# Patient Record
Sex: Female | Born: 1965 | Race: White | Hispanic: No | Marital: Married | Smoking: Never smoker
Health system: Southern US, Community
[De-identification: ages and names within clinical notes are randomized; demographics above are authoritative.]

## PROBLEM LIST (undated history)

## (undated) DIAGNOSIS — N879 Dysplasia of cervix uteri, unspecified: Secondary | ICD-10-CM

## (undated) DIAGNOSIS — C801 Malignant (primary) neoplasm, unspecified: Secondary | ICD-10-CM

## (undated) HISTORY — PX: BREAST BIOPSY: SHX20

## (undated) HISTORY — PX: CHOLECYSTECTOMY: SHX55

## (undated) HISTORY — DX: Malignant (primary) neoplasm, unspecified: C80.1

## (undated) HISTORY — PX: TONSILLECTOMY: SUR1361

## (undated) HISTORY — PX: OTHER SURGICAL HISTORY: SHX169

## (undated) HISTORY — PX: GYNECOLOGIC CRYOSURGERY: SHX857

## (undated) HISTORY — DX: Dysplasia of cervix uteri, unspecified: N87.9

---

## 2001-10-12 ENCOUNTER — Emergency Department (HOSPITAL_COMMUNITY): Admission: EM | Admit: 2001-10-12 | Discharge: 2001-10-12 | Payer: Self-pay | Admitting: *Deleted

## 2006-06-20 ENCOUNTER — Other Ambulatory Visit: Admission: RE | Admit: 2006-06-20 | Discharge: 2006-06-20 | Payer: Self-pay | Admitting: Obstetrics and Gynecology

## 2006-07-04 ENCOUNTER — Ambulatory Visit: Payer: Self-pay | Admitting: Internal Medicine

## 2006-07-22 ENCOUNTER — Ambulatory Visit: Payer: Self-pay | Admitting: Internal Medicine

## 2006-08-01 ENCOUNTER — Ambulatory Visit: Payer: Self-pay | Admitting: Unknown Physician Specialty

## 2006-08-16 ENCOUNTER — Ambulatory Visit: Payer: Self-pay | Admitting: Unknown Physician Specialty

## 2006-08-20 ENCOUNTER — Ambulatory Visit: Payer: Self-pay | Admitting: Unknown Physician Specialty

## 2006-10-23 ENCOUNTER — Ambulatory Visit: Payer: Self-pay | Admitting: Unknown Physician Specialty

## 2007-01-16 ENCOUNTER — Ambulatory Visit: Payer: Self-pay | Admitting: Surgery

## 2007-01-21 ENCOUNTER — Ambulatory Visit: Payer: Self-pay | Admitting: Surgery

## 2007-07-17 ENCOUNTER — Other Ambulatory Visit: Admission: RE | Admit: 2007-07-17 | Discharge: 2007-07-17 | Payer: Self-pay | Admitting: Obstetrics and Gynecology

## 2007-08-28 ENCOUNTER — Ambulatory Visit: Payer: Self-pay | Admitting: Internal Medicine

## 2007-09-09 ENCOUNTER — Ambulatory Visit: Payer: Self-pay | Admitting: Internal Medicine

## 2008-03-10 ENCOUNTER — Ambulatory Visit: Payer: Self-pay | Admitting: Internal Medicine

## 2008-03-22 ENCOUNTER — Ambulatory Visit: Payer: Self-pay | Admitting: Internal Medicine

## 2008-08-03 ENCOUNTER — Other Ambulatory Visit: Admission: RE | Admit: 2008-08-03 | Discharge: 2008-08-03 | Payer: Self-pay | Admitting: Obstetrics and Gynecology

## 2008-09-01 ENCOUNTER — Ambulatory Visit: Payer: Self-pay | Admitting: Internal Medicine

## 2008-09-15 ENCOUNTER — Ambulatory Visit: Payer: Self-pay | Admitting: Internal Medicine

## 2008-12-10 DIAGNOSIS — C801 Malignant (primary) neoplasm, unspecified: Secondary | ICD-10-CM

## 2008-12-10 HISTORY — DX: Malignant (primary) neoplasm, unspecified: C80.1

## 2009-08-11 ENCOUNTER — Other Ambulatory Visit: Admission: RE | Admit: 2009-08-11 | Discharge: 2009-08-11 | Payer: Self-pay | Admitting: Obstetrics and Gynecology

## 2009-08-11 ENCOUNTER — Encounter: Payer: Self-pay | Admitting: Obstetrics and Gynecology

## 2009-08-11 ENCOUNTER — Ambulatory Visit: Payer: Self-pay | Admitting: Obstetrics and Gynecology

## 2009-12-28 ENCOUNTER — Ambulatory Visit: Payer: Self-pay | Admitting: Internal Medicine

## 2010-01-09 ENCOUNTER — Ambulatory Visit: Payer: Self-pay | Admitting: Internal Medicine

## 2010-08-02 ENCOUNTER — Ambulatory Visit: Payer: Self-pay | Admitting: Internal Medicine

## 2010-08-29 ENCOUNTER — Other Ambulatory Visit: Admission: RE | Admit: 2010-08-29 | Discharge: 2010-08-29 | Payer: Self-pay | Admitting: Obstetrics and Gynecology

## 2010-08-29 ENCOUNTER — Ambulatory Visit: Payer: Self-pay | Admitting: Obstetrics and Gynecology

## 2011-08-24 ENCOUNTER — Encounter: Payer: Self-pay | Admitting: Gynecology

## 2011-08-31 ENCOUNTER — Other Ambulatory Visit: Payer: Self-pay | Admitting: *Deleted

## 2011-08-31 MED ORDER — TRIAMTERENE-HCTZ 37.5-25 MG PO CAPS: 1.0000 | ORAL_CAPSULE | Freq: Every day | ORAL | Status: DC

## 2011-08-31 NOTE — Telephone Encounter (Signed)
rx faxed to pharmacy

## 2011-09-06 ENCOUNTER — Encounter: Payer: Self-pay | Admitting: Obstetrics and Gynecology

## 2011-09-06 ENCOUNTER — Ambulatory Visit (INDEPENDENT_AMBULATORY_CARE_PROVIDER_SITE_OTHER): Payer: BC Managed Care – PPO | Admitting: Obstetrics and Gynecology

## 2011-09-06 ENCOUNTER — Other Ambulatory Visit (HOSPITAL_COMMUNITY)
Admission: RE | Admit: 2011-09-06 | Discharge: 2011-09-06 | Disposition: A | Discharge status: Home / Self Care | Payer: BC Managed Care – PPO | Source: Ambulatory Visit | Attending: Obstetrics and Gynecology | Admitting: Obstetrics and Gynecology

## 2011-09-06 VITALS — BP 138/86 | Ht 63.0 in | Wt 152.0 lb

## 2011-09-06 DIAGNOSIS — E8779 Other fluid overload: Secondary | ICD-10-CM

## 2011-09-06 DIAGNOSIS — C801 Malignant (primary) neoplasm, unspecified: Secondary | ICD-10-CM | POA: Insufficient documentation

## 2011-09-06 DIAGNOSIS — Z01419 Encounter for gynecological examination (general) (routine) without abnormal findings: Secondary | ICD-10-CM

## 2011-09-06 DIAGNOSIS — R609 Edema, unspecified: Secondary | ICD-10-CM

## 2011-09-06 MED ORDER — TRIAMTERENE-HCTZ 37.5-25 MG PO CAPS: 1.0000 | ORAL_CAPSULE | Freq: Every day | ORAL | Status: DC

## 2011-09-06 NOTE — Progress Notes (Signed)
Patient came to see me today for her annual GYN exam. Her periods are fine. She contraceptives by vasectomy. She is due for her yearly mammogram which she doesn't Edie. We give her her fluid pill which she uses during her cycles.  Physical examination: HEENT within normal limits. Neck: Thyroid not large. No masses. Supraclavicular nodes: not enlarged. Breasts: Examined in both sitting midline position. No skin changes and no masses. Abdomen: Soft no guarding rebound or masses or hernia. Pelvic: External: Within normal limits. BUS: Within normal limits. Vaginal:within normal limits. Good estrogen effect. No evidence of cystocele rectocele or enterocele. Cervix: clean. Uterus: Normal size and shape. Adnexa: No masses. Rectovaginal exam: Confirmatory and negative. Extremities: Within normal limits.  Assessment: Normal GYN exam. Fluid retention  Plan: Continue Dyazide as above, yearly mammogram, lab work ordered which she will do at the [place] [facility].

## 2011-09-13 ENCOUNTER — Encounter: Payer: Self-pay | Admitting: Obstetrics and Gynecology

## 2011-10-05 ENCOUNTER — Ambulatory Visit: Payer: Self-pay | Admitting: Internal Medicine

## 2011-12-18 ENCOUNTER — Other Ambulatory Visit: Payer: Self-pay | Admitting: Obstetrics and Gynecology

## 2011-12-18 ENCOUNTER — Ambulatory Visit (INDEPENDENT_AMBULATORY_CARE_PROVIDER_SITE_OTHER): Payer: BC Managed Care – PPO | Admitting: Obstetrics and Gynecology

## 2011-12-18 DIAGNOSIS — N938 Other specified abnormal uterine and vaginal bleeding: Secondary | ICD-10-CM

## 2011-12-18 DIAGNOSIS — R1032 Left lower quadrant pain: Secondary | ICD-10-CM

## 2011-12-18 DIAGNOSIS — N949 Unspecified condition associated with female genital organs and menstrual cycle: Secondary | ICD-10-CM

## 2011-12-18 MED ORDER — MEGESTROL ACETATE 40 MG PO TABS: 40.0000 mg | ORAL_TABLET | Freq: Two times a day (BID) | ORAL | Status: AC

## 2011-12-18 NOTE — Progress Notes (Signed)
Patient came to see me today because of abnormal bleeding. She had a normal period on November 29 which ended on December 4. She's then either spotted or bled most days up to and including today. She's also noticed some intermittent left lower quadrant pain which she felt was her ovary trying to start her cycle. The only other change in her history was a steroid shot she got from her dermatologist in late November. She contraceptives by vasectomy.  Pelvic exam: External within normal limits. BUS within normal limits. Vaginal exam within normal limits. Cervix is clean without lesions. Uterus is normal size and shape. Adnexa failed to reveal masses. Rectovaginal examination is confirmatory and without masses. KimGardener present.  Assessment: DUB. Left lower quadrant pain.  Plan: Endometrial biopsy done. Megace 40 mg twice a day for 20 days. I told patient that I felt a steroid shot might be the cause. If the DUB persists or if pain persists call and we will schedule [facility].

## 2012-01-03 ENCOUNTER — Telehealth: Payer: Self-pay | Admitting: *Deleted

## 2012-01-03 NOTE — Telephone Encounter (Signed)
Pt called stating she still had some on and off bleeding while on megace 40 mg. Pt informed to continue to take rx for 20 days as directed by Dr.G. Pt will follow up if bleeding after rx.

## 2012-02-26 ENCOUNTER — Telehealth: Payer: Self-pay

## 2012-02-26 NOTE — Telephone Encounter (Signed)
Tell her I think she must have another endometrial polyp. Start her on Megace 40 mg twice a day to stop the bleeding. Schedule saline infusion histogram here. She needs to stay on Megace until she has the saline infusion histogram done. Schedule it for next week.

## 2012-02-26 NOTE — Telephone Encounter (Signed)
PT. NOTIFIED OF G'S NOTE BELOW AND TRANSFERRED HER TO APPTS. 

## 2012-02-26 NOTE — Telephone Encounter (Signed)
PT. SAW YOU ABOUT 1 MONTH AGO. YOU GAVE HER MEGACE AND BLEEDING FINALLY STOPPED AFTER SHE WAS ON THE LAST WEEK OF RX. THEN NO BLEEDING X 27 DAYS. NOW C-O BLEEDING X 3 WEEKS. SHE WILL ALTERNATE BETWEEN ONE EPISODE OF A GUSH OF BLOOD DAILY TO SPOTTING. STATES YOU WANTED HER TO CALL BACK IF FURTHER PROBLEMS

## 2012-03-10 HISTORY — PX: OTHER SURGICAL HISTORY: SHX169

## 2012-03-14 ENCOUNTER — Ambulatory Visit (INDEPENDENT_AMBULATORY_CARE_PROVIDER_SITE_OTHER): Payer: BC Managed Care – PPO | Admitting: Obstetrics and Gynecology

## 2012-03-14 ENCOUNTER — Ambulatory Visit (INDEPENDENT_AMBULATORY_CARE_PROVIDER_SITE_OTHER): Payer: BC Managed Care – PPO

## 2012-03-14 ENCOUNTER — Other Ambulatory Visit: Payer: Self-pay | Admitting: Obstetrics and Gynecology

## 2012-03-14 DIAGNOSIS — N938 Other specified abnormal uterine and vaginal bleeding: Secondary | ICD-10-CM

## 2012-03-14 DIAGNOSIS — R1032 Left lower quadrant pain: Secondary | ICD-10-CM

## 2012-03-14 DIAGNOSIS — N83209 Unspecified ovarian cyst, unspecified side: Secondary | ICD-10-CM

## 2012-03-14 DIAGNOSIS — N939 Abnormal uterine and vaginal bleeding, unspecified: Secondary | ICD-10-CM

## 2012-03-14 DIAGNOSIS — N949 Unspecified condition associated with female genital organs and menstrual cycle: Secondary | ICD-10-CM

## 2012-03-14 DIAGNOSIS — N926 Irregular menstruation, unspecified: Secondary | ICD-10-CM

## 2012-03-14 NOTE — Progress Notes (Signed)
Patient came back today for a saline infusion histogram. She had been having normal cycles until last fall. After receiving a steroid injection from her dermatologist she had   Menomenorrhagia. She was treated with Megace and an endometrial biopsy revealed an endometrial polyp. She has had another episode of prolonged bleeding which is heavy on certain days. She is currently finishing another cycle of Megace. On ultrasound today she did have some mild cystic changes in her myometrium consistent with adenomyosis. Her endometrial echo is thin at 5.1 mm. Her right ovary has an echo free thin-walled avascular cyst of 1.7 cm. Her left ovary is normal. Her cul-de-sac is free of fluid. Saline was introduced into the uterus. We had some difficulty seeing the endometrial cavity due to her retroverted uterus and we actually replaced a catheter to be sure but no endometrial cavity defects were seen.  Assessment: Dysfunctional uterine bleeding. Adenomyosis suspected.  Plan: Discussed a Mirena IUD or endometrial ablation if problem persists. Patient will watch now that she finished her Megace and reported abnormal bleeding. Patient given information on her option.

## 2012-03-18 ENCOUNTER — Telehealth: Payer: Self-pay

## 2012-03-18 NOTE — Telephone Encounter (Signed)
Patient called and asked me to check insurance benefits for Her Option Ablation.  I called her back and let her know it is an eligible benefit when medically necessary and when done in in-network Dr. Isidore Moos it is covered with a $25 copaypment then it pays 100% of allowable charges.    Patient said her bleeding started again Sunday night and is back and forth heavy to light.  She is ready to schedule this if Dr. Reece Agar thinks okay.  I told her I will relay this to Dr. Reece Agar and call her on Weds. Morning.

## 2012-03-19 ENCOUNTER — Other Ambulatory Visit: Payer: Self-pay

## 2012-03-19 ENCOUNTER — Telehealth: Payer: Self-pay

## 2012-03-19 DIAGNOSIS — N920 Excessive and frequent menstruation with regular cycle: Secondary | ICD-10-CM

## 2012-03-19 MED ORDER — MEGESTROL ACETATE 40 MG PO TABS: ORAL_TABLET | ORAL | Status: DC

## 2012-03-19 MED ORDER — MISOPROSTOL 200 MCG PO TABS: ORAL_TABLET | ORAL | Status: DC

## 2012-03-19 NOTE — Telephone Encounter (Signed)
Start Megace 40 mg daily today and stay on until we do the ablation.

## 2012-03-19 NOTE — Telephone Encounter (Signed)
Patient had regular periods until Nov 25, 2011.  Since then she has bled/spotted three weeks at a time and will have to usually have Megace to stop it. The latest round of bleeding was from March 3 to March 22 and then she started Megace.  She had taken her last Megace on April 4th and started bleeding again on Sunday April 7th.

## 2012-03-19 NOTE — Telephone Encounter (Signed)
Error/already had encounter opened. 

## 2012-03-19 NOTE — Telephone Encounter (Signed)
Okay to schedule endometrial ablation. Was the bleeding at the correct time of the month or not? We need to start progesterone and I need to know first day of LMP or was that Sunday?

## 2012-03-19 NOTE — Telephone Encounter (Signed)
Patient was informed regarding starting Megace daily until ablation.  We scheduled patient for Friday, April 26 8:30 am for Ablation.  She will come for consultation with Dr. Reece Agar on Weds., April 17 4:00pm.  She was instructed regarding needing someone to drive her to and from surgery and also regarding needing a full bladder for the day of surgery. Instructions for that reviewed with patient.  Full bladder instruction sheet mailed to patient with all her dates on it.  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  That letter was sent to patient as well and copy is in e-chart.  I told her about the need for Cytotec tab vag hs before procedure and I went ahead and e-scribed that at the same time I e-scribed her Megace.  She knows we cannot do ablation if she is bleeding.  She will call me first of the week if Megace has not stopped her bleeding.  She has my direct number if any questions.

## 2012-03-26 ENCOUNTER — Ambulatory Visit (INDEPENDENT_AMBULATORY_CARE_PROVIDER_SITE_OTHER): Payer: BC Managed Care – PPO | Admitting: Obstetrics and Gynecology

## 2012-03-26 ENCOUNTER — Encounter: Payer: Self-pay | Admitting: *Deleted

## 2012-03-26 DIAGNOSIS — N920 Excessive and frequent menstruation with regular cycle: Secondary | ICD-10-CM

## 2012-03-26 MED ORDER — OXYCODONE-ACETAMINOPHEN 5-500 MG PO CAPS: 1.0000 | ORAL_CAPSULE | ORAL | Status: AC | PRN

## 2012-03-26 MED ORDER — AZITHROMYCIN 500 MG PO TABS: 500.0000 mg | ORAL_TABLET | Freq: Every day | ORAL | Status: AC

## 2012-03-26 MED ORDER — DIAZEPAM 10 MG PO TABS: 10.0000 mg | ORAL_TABLET | Freq: Four times a day (QID) | ORAL | Status: AC | PRN

## 2012-03-26 NOTE — Progress Notes (Signed)
Patient came back today to discuss her menorrhagia. Saline infusion histogram was done previously and showed adenomyosis. She has had another episode of heavy bleeding and we've had to retreat her with Megace. She has stopped bleeding and she still is on her Megace. She would like to proceed with ablation. She contracepts by vasectomy. We had a long discussion describing the procedure. We discussed success rate. We discussed amenorrhea rate. We discussed complications. All her questions were answered. Prescriptions were written for Valium, Tylox, and Zithromax. She already  has a prescription for Cytotec. Written  instructions were given her. She will return next week for ablation. She will stay on Megace 40 mg daily until she has her ablation.

## 2012-03-26 NOTE — Progress Notes (Signed)
Patient ID: Kristine Foley, female   DOB: 27-Aug-1966, 46 y.o.   MRN: [id number] Rosanne Ashing from [facility] pharmacy called to clarify Zithromax 500 mg should be take 1 po daily x 2 days.

## 2012-04-04 ENCOUNTER — Ambulatory Visit: Payer: BC Managed Care – PPO | Admitting: Obstetrics and Gynecology

## 2012-04-04 ENCOUNTER — Other Ambulatory Visit: Payer: BC Managed Care – PPO

## 2012-04-04 ENCOUNTER — Ambulatory Visit (INDEPENDENT_AMBULATORY_CARE_PROVIDER_SITE_OTHER): Payer: BC Managed Care – PPO | Admitting: Obstetrics and Gynecology

## 2012-04-04 ENCOUNTER — Other Ambulatory Visit: Payer: Self-pay | Admitting: *Deleted

## 2012-04-04 ENCOUNTER — Ambulatory Visit (INDEPENDENT_AMBULATORY_CARE_PROVIDER_SITE_OTHER): Payer: BC Managed Care – PPO

## 2012-04-04 ENCOUNTER — Ambulatory Visit: Payer: BC Managed Care – PPO

## 2012-04-04 VITALS — BP 108/76 | HR 70

## 2012-04-04 DIAGNOSIS — N920 Excessive and frequent menstruation with regular cycle: Secondary | ICD-10-CM

## 2012-04-04 DIAGNOSIS — N949 Unspecified condition associated with female genital organs and menstrual cycle: Secondary | ICD-10-CM

## 2012-04-04 MED ORDER — LIDOCAINE HCL (PF) 1 % IJ SOLN
20.0000 mL | Freq: Once | INTRAMUSCULAR | Status: AC
  Administered 2012-04-04: 20 mL

## 2012-04-04 MED ORDER — KETOROLAC TROMETHAMINE 60 MG/2ML IM SOLN
60.0000 mg | Freq: Once | INTRAMUSCULAR | Status: AC
  Administered 2012-04-04: 60 mg via INTRAMUSCULAR

## 2012-04-04 NOTE — Progress Notes (Signed)
Her Option Procedural Note: Patient: Kristine Foley                                                 Patient ID number: Date of procedure:April 04, 2013 Diagnoses:Mennorhagia Procedure: Endometrial cryoablation with intraoperative ultrasonic guidance.  Procedure: The patient was brought to the treatment room having previously been counseled for the procedure and having signed the consent form. The patient was placed in the dorsolithotomy position and a speculum was inserted. The cervix and vagina were cleaned with Betadine. A single-tooth tenaculum was placed on the anterior lip of the cervix. A paracervical block was placed with 20 cc 1% plain Xylocaine. The uterus was sounded to  7   Centimeters. Under ultrasonic guidance the her option probe was introduced into the uterine cavity after the pre-procedural sequence was performed. After assuring proper corneal placement cryoablation was then performed under continuous ultrasound guidance monitoring the growth of the cryozone. Sequential cryoablation were performed in the following order, locations, freeze times and post freeze myometrial depths.           Location of freeze             Length of time         Myometrial depth 1.     Right cornual                                  6   Mi                    8  Millimeter 2.      Left cornual                                    6  Minutes             7 Millimeter 3.  Upon completion of the procedure, the instruments were removed, hemostasis visualized and the patient was assisted to the bathroom and then another exam room where she was observed.  The patient tolerated the procedure well and was released in stable condition with her driver along with a copy of the post procedure instructions which were reviewed with her. She is to return to the office in 2 weeks for a post procedure check.

## 2012-04-23 ENCOUNTER — Encounter: Payer: Self-pay | Admitting: Obstetrics and Gynecology

## 2012-04-23 ENCOUNTER — Ambulatory Visit (INDEPENDENT_AMBULATORY_CARE_PROVIDER_SITE_OTHER): Payer: BC Managed Care – PPO | Admitting: Obstetrics and Gynecology

## 2012-04-23 DIAGNOSIS — N920 Excessive and frequent menstruation with regular cycle: Secondary | ICD-10-CM

## 2012-04-23 NOTE — Progress Notes (Signed)
Patient came back to see me for postoperative visit after endometrial ablation. She is still having a watery discharge but no bleeding. She feels that all is back to normal.  Exam: Kennon Portela present.Pelvic exam: External within normal limits. BUS within normal limits. Vaginal exam within normal limits. Cervix is clean without lesions. Uterus is normal size and shape. Adnexa failed to reveal masses. Rectovaginal examination is confirmatory and without masses.   Assessment: Normal postoperative exam  Plan: Return for annual exam at normal time. Call when necessary.

## 2012-05-27 ENCOUNTER — Ambulatory Visit: Payer: Self-pay | Admitting: Unknown Physician Specialty

## 2012-05-29 LAB — PATHOLOGY REPORT

## 2012-09-08 ENCOUNTER — Encounter: Payer: BC Managed Care – PPO | Admitting: Obstetrics and Gynecology

## 2012-09-09 ENCOUNTER — Encounter: Payer: Self-pay | Admitting: Obstetrics and Gynecology

## 2012-09-09 ENCOUNTER — Ambulatory Visit (INDEPENDENT_AMBULATORY_CARE_PROVIDER_SITE_OTHER): Payer: BC Managed Care – PPO | Admitting: Obstetrics and Gynecology

## 2012-09-09 VITALS — BP 124/66 | Ht 63.0 in | Wt 130.0 lb

## 2012-09-09 DIAGNOSIS — Z01419 Encounter for gynecological examination (general) (routine) without abnormal findings: Secondary | ICD-10-CM

## 2012-09-09 MED ORDER — TRIAMTERENE-HCTZ 37.5-25 MG PO CAPS: 1.0000 | ORAL_CAPSULE | ORAL | Status: DC

## 2012-09-09 NOTE — Patient Instructions (Addendum)
Get a mammogram. Get CBC, HgbA1c. And Lipid panel done.

## 2012-09-09 NOTE — Progress Notes (Signed)
Patient came to see me today for her annual GYN exam. Since we did her endometrial ablation her cycles are 70% better. She still has monthly cycles but they are light. She uses vasectomy for birth control. She is having no pelvic pain. She is having no abnormal bleeding. She has scheduled her mammogram for next month at the [facility]. She uses a diuretic that we give her 3-4 days a month prior to her cycle. She was treated for cervical dysplasia as a teenager but has had normal Pap smears since then. She was treated with cryosurgery. She had her last Pap smear in 2012. She will do her lab work at her job and we will give her a slip.  Physical examination:Kristine Foley present.HEENT within normal limits. Neck: Thyroid not large. No masses. Supraclavicular nodes: not enlarged. Breasts: Examined in both sitting and lying  position. No skin changes and no masses. Abdomen: Soft no guarding rebound or masses or hernia. Pelvic: External: Within normal limits. BUS: Within normal limits. Vaginal:within normal limits. Good estrogen effect. No evidence of cystocele rectocele or enterocele. Cervix: clean. Uterus: Normal size and shape. Adnexa: No masses. Rectovaginal exam: Confirmatory and negative. Extremities: Within normal limits.  Assessment: #1. Menorrhagia resolved with endometrial ablation #2. Premenstrual fluid retention  Plan: Mammogram. Appropriate lab work ordered. Continue Dyazide.The new Pap smear guidelines were discussed with the patient. Pap not done.

## 2012-09-10 LAB — URINALYSIS W MICROSCOPIC + REFLEX CULTURE
Bacteria, UA: NONE SEEN
Bilirubin Urine: NEGATIVE
Crystals: NONE SEEN
Hgb urine dipstick: NEGATIVE
Ketones, ur: NEGATIVE mg/dL
Nitrite: NEGATIVE
Specific Gravity, Urine: 1.015 (ref 1.005–1.030)
Urobilinogen, UA: 0.2 mg/dL (ref 0.0–1.0)

## 2012-10-07 ENCOUNTER — Ambulatory Visit: Payer: Self-pay | Admitting: Internal Medicine

## 2012-10-08 ENCOUNTER — Encounter: Payer: Self-pay | Admitting: Obstetrics and Gynecology

## 2013-09-10 ENCOUNTER — Encounter: Payer: BC Managed Care – PPO | Admitting: Gynecology

## 2013-09-15 ENCOUNTER — Encounter: Payer: Self-pay | Admitting: Gynecology

## 2013-09-15 ENCOUNTER — Ambulatory Visit (INDEPENDENT_AMBULATORY_CARE_PROVIDER_SITE_OTHER): Payer: BC Managed Care – PPO | Admitting: Gynecology

## 2013-09-15 VITALS — BP 124/80 | Ht 63.0 in | Wt 129.0 lb

## 2013-09-15 DIAGNOSIS — Z13220 Encounter for screening for lipoid disorders: Secondary | ICD-10-CM

## 2013-09-15 DIAGNOSIS — N926 Irregular menstruation, unspecified: Secondary | ICD-10-CM

## 2013-09-15 DIAGNOSIS — Z01419 Encounter for gynecological examination (general) (routine) without abnormal findings: Secondary | ICD-10-CM

## 2013-09-15 MED ORDER — TRIAMTERENE-HCTZ 37.5-25 MG PO CAPS: 1.0000 | ORAL_CAPSULE | ORAL | Status: DC

## 2013-09-15 NOTE — Patient Instructions (Signed)
Office will call you with lab results 

## 2013-09-15 NOTE — Progress Notes (Signed)
Kristine Foley Feb 12, 1966 [id number]        47 y.o.  G1P1001 for annual exam.  Former patient of Dr. Eda Paschal. Several issues noted below.  Past medical history,surgical history, medications, allergies, family history and social history were all reviewed and documented in the EPIC chart.  ROS:  Performed and pertinent positives and negatives are included in the history, assessment and plan .  Exam: Kim assistant Filed Vitals:   09/15/13 1550  BP: 124/80  Height: 5\' 3"  (1.6 m)  Weight: 129 lb (58.514 kg)   General appearance  Normal Skin grossly normal Head/Neck normal with no cervical or supraclavicular adenopathy thyroid normal Lungs  clear Cardiac RR, without RMG Abdominal  soft, nontender, without masses, organomegaly or hernia Breasts  examined lying and sitting without masses, retractions, discharge or axillary adenopathy. Pelvic  Ext/BUS/vagina  normal  Cervix  normal  Uterus  anteverted, normal size, shape and contour, midline and mobile nontender   Adnexa  Without masses or tenderness    Anus and perineum  normal   Rectovaginal  normal sphincter tone without palpated masses or tenderness.    Assessment/Plan:  47 y.o. [name] female for annual exam, irregular menses, vasectomy birth control.   1. Irregular menses. Patient is status post Her option endometrial ablation 03/2012. Had a light regular menses afterwards until [facility] July 2014. Feels like she's having the symptoms of her period to include bloating and breast tenderness but never has any flow. Check baseline labs to include TSH FSH prolactin. I reviewed the issues to include anovulation versus no endometrium to shed versus premature menopause. If labs normal plan progesterone challenge with Provera 10 mg x10 days. Will challenge every 2-3 months if remains without menses. Resumes spontaneous menses and will follow. If otherwise then we'll triage based upon results. 2. Pap smear 2012. No Pap smear done today. 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 Plan repeat Pap smear next year a 3 year interval. 3. Mammography 09/2012. Patient has scheduled in November and will followup for this. SBE monthly reviewed. Premenstrual swelling. Patient uses 4. Dyazide for several days each month due to bloating per Dr. Eda Paschal. Will refill #30 with 2 refills. Discussed if she needs to use it on a more regular basis then she will need to see a primary care physician to evaluate for renal, cardiac etc. disorders. 5. Health maintenance. Baseline CBC comprehensive metabolic panel lipid profile urinalysis ordered along with her above blood work. Followup for lab results otherwise annually.  Note: This document was prepared with digital dictation and possible smart phrase technology. Any transcriptional errors that result from this process are unintentional.   Kristine Lords MD, 4:23 PM 09/15/2013

## 2013-09-16 ENCOUNTER — Other Ambulatory Visit: Payer: Self-pay | Admitting: Gynecology

## 2013-09-16 LAB — CBC WITH DIFFERENTIAL/PLATELET
Basophils Absolute: 0.1 10*3/uL (ref 0.0–0.1)
Basophils Relative: 1 % (ref 0–1)
Hemoglobin: 14.2 g/dL (ref 12.0–15.0)
MCHC: 34.1 g/dL (ref 30.0–36.0)
Monocytes Relative: 8 % (ref 3–12)
Neutro Abs: 5.3 10*3/uL (ref 1.7–7.7)
Neutrophils Relative %: 63 % (ref 43–77)
RDW: 14.1 % (ref 11.5–15.5)

## 2013-09-16 LAB — COMPREHENSIVE METABOLIC PANEL
ALT: 14 U/L (ref 0–35)
AST: 14 U/L (ref 0–37)
Albumin: 4.4 g/dL (ref 3.5–5.2)
Alkaline Phosphatase: 66 U/L (ref 39–117)
Potassium: 4.6 mEq/L (ref 3.5–5.3)
Sodium: 135 mEq/L (ref 135–145)
Total Protein: 7.2 g/dL (ref 6.0–8.3)

## 2013-09-16 LAB — URINALYSIS W MICROSCOPIC + REFLEX CULTURE
Bacteria, UA: NONE SEEN
Hgb urine dipstick: NEGATIVE
Leukocytes, UA: NEGATIVE
Nitrite: NEGATIVE
Protein, ur: NEGATIVE mg/dL

## 2013-09-16 LAB — LIPID PANEL: LDL Cholesterol: 106 mg/dL — ABNORMAL HIGH (ref 0–99)

## 2013-09-16 LAB — TSH: TSH: 2.407 u[IU]/mL (ref 0.350–4.500)

## 2013-09-16 LAB — FOLLICLE STIMULATING HORMONE: FSH: 7 m[IU]/mL

## 2013-09-16 MED ORDER — MEDROXYPROGESTERONE ACETATE 10 MG PO TABS: 10.0000 mg | ORAL_TABLET | Freq: Every day | ORAL | Status: DC

## 2013-10-08 ENCOUNTER — Ambulatory Visit: Payer: Self-pay | Admitting: Internal Medicine

## 2013-10-15 ENCOUNTER — Other Ambulatory Visit: Payer: Self-pay

## 2014-09-17 ENCOUNTER — Encounter: Payer: BC Managed Care – PPO | Admitting: Gynecology

## 2014-10-07 ENCOUNTER — Encounter: Payer: Self-pay | Admitting: Gynecology

## 2014-10-07 ENCOUNTER — Other Ambulatory Visit (HOSPITAL_COMMUNITY)
Admission: RE | Admit: 2014-10-07 | Discharge: 2014-10-07 | Disposition: A | Discharge status: Home / Self Care | Payer: BC Managed Care – PPO | Source: Ambulatory Visit | Attending: Gynecology | Admitting: Gynecology

## 2014-10-07 ENCOUNTER — Ambulatory Visit (INDEPENDENT_AMBULATORY_CARE_PROVIDER_SITE_OTHER): Payer: BC Managed Care – PPO | Admitting: Gynecology

## 2014-10-07 VITALS — BP 120/74 | Ht 63.0 in | Wt 131.0 lb

## 2014-10-07 DIAGNOSIS — Z1151 Encounter for screening for human papillomavirus (HPV): Secondary | ICD-10-CM | POA: Diagnosis present

## 2014-10-07 DIAGNOSIS — Z01419 Encounter for gynecological examination (general) (routine) without abnormal findings: Secondary | ICD-10-CM | POA: Diagnosis not present

## 2014-10-07 LAB — CBC WITH DIFFERENTIAL/PLATELET
BASOS ABS: 0.1 10*3/uL (ref 0.0–0.1)
Basophils Relative: 1 % (ref 0–1)
EOS PCT: 1 % (ref 0–5)
Eosinophils Absolute: 0.1 10*3/uL (ref 0.0–0.7)
HCT: 39.5 % (ref 36.0–46.0)
Hemoglobin: 13.8 g/dL (ref 12.0–15.0)
LYMPHS PCT: 32 % (ref 12–46)
Lymphs Abs: 1.9 10*3/uL (ref 0.7–4.0)
MCH: 30.7 pg (ref 26.0–34.0)
MCHC: 34.9 g/dL (ref 30.0–36.0)
MCV: 87.8 fL (ref 78.0–100.0)
Monocytes Absolute: 0.5 10*3/uL (ref 0.1–1.0)
Monocytes Relative: 9 % (ref 3–12)
NEUTROS PCT: 57 % (ref 43–77)
Neutro Abs: 3.3 10*3/uL (ref 1.7–7.7)
PLATELETS: 264 10*3/uL (ref 150–400)
RBC: 4.5 MIL/uL (ref 3.87–5.11)
RDW: 13.5 % (ref 11.5–15.5)
WBC: 5.8 10*3/uL (ref 4.0–10.5)

## 2014-10-07 LAB — COMPREHENSIVE METABOLIC PANEL
ALT: 12 U/L (ref 0–35)
AST: 12 U/L (ref 0–37)
Albumin: 4.2 g/dL (ref 3.5–5.2)
Alkaline Phosphatase: 54 U/L (ref 39–117)
BUN: 16 mg/dL (ref 6–23)
CALCIUM: 9.1 mg/dL (ref 8.4–10.5)
CO2: 28 mEq/L (ref 19–32)
Chloride: 104 mEq/L (ref 96–112)
Creat: 0.68 mg/dL (ref 0.50–1.10)
Glucose, Bld: 96 mg/dL (ref 70–99)
Potassium: 4.1 mEq/L (ref 3.5–5.3)
Sodium: 137 mEq/L (ref 135–145)
Total Bilirubin: 0.8 mg/dL (ref 0.2–1.2)
Total Protein: 6.7 g/dL (ref 6.0–8.3)

## 2014-10-07 LAB — LIPID PANEL
CHOL/HDL RATIO: 3.3 ratio
Cholesterol: 204 mg/dL — ABNORMAL HIGH (ref 0–200)
HDL: 61 mg/dL (ref >39)
LDL Cholesterol: 120 mg/dL — ABNORMAL HIGH (ref 0–99)
Triglycerides: 117 mg/dL (ref <150)
VLDL: 23 mg/dL (ref 0–40)

## 2014-10-07 MED ORDER — TRIAMTERENE-HCTZ 37.5-25 MG PO CAPS: 1.0000 | ORAL_CAPSULE | ORAL | Status: DC

## 2014-10-07 NOTE — Patient Instructions (Signed)
You may obtain a copy of any labs that were done today by logging onto MyChart as outlined in the instructions provided with your AVS (after visit summary). The office will not call with normal lab results but certainly if there are any significant abnormalities then we will contact you.   Health Maintenance, Female A healthy lifestyle and preventative care can promote health and wellness.  Maintain regular health, dental, and eye exams.  Eat a healthy diet. Foods like vegetables, fruits, whole grains, low-fat dairy products, and lean protein foods contain the nutrients you need without too many calories. Decrease your intake of foods high in solid fats, added sugars, and salt. Get information about a proper diet from your caregiver, if necessary.  Regular physical exercise is one of the most important things you can do for your health. Most adults should get at least 150 minutes of moderate-intensity exercise (any activity that increases your heart rate and causes you to sweat) each week. In addition, most adults need muscle-strengthening exercises on 2 or more days a week.   Maintain a healthy weight. The body mass index (BMI) is a screening tool to identify possible weight problems. It provides an estimate of body fat based on height and weight. Your caregiver can help determine your BMI, and can help you achieve or maintain a healthy weight. For adults 20 years and older:  A BMI below 18.5 is considered underweight.  A BMI of 18.5 to 24.9 is normal.  A BMI of 25 to 29.9 is considered overweight.  A BMI of 30 and above is considered obese.  Maintain normal blood lipids and cholesterol by exercising and minimizing your intake of saturated fat. Eat a balanced diet with plenty of fruits and vegetables. Blood tests for lipids and cholesterol should begin at age 61 and be repeated every 5 years. If your lipid or cholesterol levels are high, you are over 50, or you are a high risk for heart  disease, you may need your cholesterol levels checked more frequently.Ongoing high lipid and cholesterol levels should be treated with medicines if diet and exercise are not effective.  If you smoke, find out from your caregiver how to quit. If you do not use tobacco, do not start.  Lung cancer screening is recommended for adults aged 33 80 years who are at high risk for developing lung cancer because of a history of smoking. Yearly low-dose computed tomography (CT) is recommended for people who have at least a 30-pack-year history of smoking and are a current smoker or have quit within the past 15 years. A pack year of smoking is smoking an average of 1 pack of cigarettes a day for 1 year (for example: 1 pack a day for 30 years or 2 packs a day for 15 years). Yearly screening should continue until the smoker has stopped smoking for at least 15 years. Yearly screening should also be stopped for people who develop a health problem that would prevent them from having lung cancer treatment.  If you are pregnant, do not drink alcohol. If you are breastfeeding, be very cautious about drinking alcohol. If you are not pregnant and choose to drink alcohol, do not exceed 1 drink per day. One drink is considered to be 12 ounces (355 mL) of beer, 5 ounces (148 mL) of wine, or 1.5 ounces (44 mL) of liquor.  Avoid use of street drugs. Do not share needles with anyone. Ask for help if you need support or instructions about stopping  the use of drugs.  High blood pressure causes heart disease and increases the risk of stroke. Blood pressure should be checked at least every 1 to 2 years. Ongoing high blood pressure should be treated with medicines, if weight loss and exercise are not effective.  If you are 59 to 47 years old, ask your caregiver if you should take aspirin to prevent strokes.  Diabetes screening involves taking a blood sample to check your fasting blood sugar level. This should be done once every 3  years, after age 91, if you are within normal weight and without risk factors for diabetes. Testing should be considered at a younger age or be carried out more frequently if you are overweight and have at least 1 risk factor for diabetes.  Breast cancer screening is essential preventative care for women. You should practice "breast self-awareness." This means understanding the normal appearance and feel of your breasts and may include breast self-examination. Any changes detected, no matter how small, should be reported to a caregiver. Women in their 66s and 30s should have a clinical breast exam (CBE) by a caregiver as part of a regular health exam every 1 to 3 years. After age 101, women should have a CBE every year. Starting at age 100, women should consider having a mammogram (breast X-ray) every year. Women who have a family history of breast cancer should talk to their caregiver about genetic screening. Women at a high risk of breast cancer should talk to their caregiver about having an MRI and a mammogram every year.  Breast cancer gene (BRCA)-related cancer risk assessment is recommended for women who have family members with BRCA-related cancers. BRCA-related cancers include breast, ovarian, tubal, and peritoneal cancers. Having family members with these cancers may be associated with an increased risk for harmful changes (mutations) in the breast cancer genes BRCA1 and BRCA2. Results of the assessment will determine the need for genetic counseling and BRCA1 and BRCA2 testing.  The Pap test is a screening test for cervical cancer. Women should have a Pap test starting at age 57. Between ages 25 and 35, Pap tests should be repeated every 2 years. Beginning at age 37, you should have a Pap test every 3 years as long as the past 3 Pap tests have been normal. If you had a hysterectomy for a problem that was not cancer or a condition that could lead to cancer, then you no longer need Pap tests. If you are  between ages 50 and 76, and you have had normal Pap tests going back 10 years, you no longer need Pap tests. If you have had past treatment for cervical cancer or a condition that could lead to cancer, you need Pap tests and screening for cancer for at least 20 years after your treatment. If Pap tests have been discontinued, risk factors (such as a new sexual partner) need to be reassessed to determine if screening should be resumed. Some women have medical problems that increase the chance of getting cervical cancer. In these cases, your caregiver may recommend more frequent screening and Pap tests.  The human papillomavirus (HPV) test is an additional test that may be used for cervical cancer screening. The HPV test looks for the virus that can cause the cell changes on the cervix. The cells collected during the Pap test can be tested for HPV. The HPV test could be used to screen women aged 44 years and older, and should be used in women of any age  who have unclear Pap test results. After the age of 55, women should have HPV testing at the same frequency as a Pap test.  Colorectal cancer can be detected and often prevented. Most routine colorectal cancer screening begins at the age of 44 and continues through age 20. However, your caregiver may recommend screening at an earlier age if you have risk factors for colon cancer. On a yearly basis, your caregiver may provide home test kits to check for hidden blood in the stool. Use of a small camera at the end of a tube, to directly examine the colon (sigmoidoscopy or colonoscopy), can detect the earliest forms of colorectal cancer. Talk to your caregiver about this at age 86, when routine screening begins. Direct examination of the colon should be repeated every 5 to 10 years through age 13, unless early forms of pre-cancerous polyps or small growths are found.  Hepatitis C blood testing is recommended for all people born from [birth year] through 1964 and any  individual with known risks for hepatitis C.  Practice safe sex. Use condoms and avoid high-risk sexual practices to reduce the spread of sexually transmitted infections (STIs). Sexually active women aged 36 and younger should be checked for Chlamydia, which is a common sexually transmitted infection. Older women with new or multiple partners should also be tested for Chlamydia. Testing for other STIs is recommended if you are sexually active and at increased risk.  Osteoporosis is a disease in which the bones lose minerals and strength with aging. This can result in serious bone fractures. The risk of osteoporosis can be identified using a bone density scan. Women ages 20 and over and women at risk for fractures or osteoporosis should discuss screening with their caregivers. Ask your caregiver whether you should be taking a calcium supplement or vitamin D to reduce the rate of osteoporosis.  Menopause can be associated with physical symptoms and risks. Hormone replacement therapy is available to decrease symptoms and risks. You should talk to your caregiver about whether hormone replacement therapy is right for you.  Use sunscreen. Apply sunscreen liberally and repeatedly throughout the day. You should seek shade when your shadow is shorter than you. Protect yourself by wearing long sleeves, pants, a wide-brimmed hat, and sunglasses year round, whenever you are outdoors.  Notify your caregiver of new moles or changes in moles, especially if there is a change in shape or color. Also notify your caregiver if a mole is larger than the size of a pencil eraser.  Stay current with your immunizations. Document Released: 06/11/2011 Document Revised: 03/23/2013 Document Reviewed: 06/11/2011 [facility] Patient Information 2014 [employer].

## 2014-10-07 NOTE — Progress Notes (Signed)
William Laske Thornell [birth year] [id number]        48 y.o.  G1P1001 for annual exam.  Doing well.  Past medical history,surgical history, problem list, medications, allergies, family history and social history were all reviewed and documented as reviewed in the EPIC chart.  ROS:  12 system ROS performed with pertinent positives and negatives included in the history, assessment and plan.   Additional significant findings :  none   Exam: Kim Counsellor Vitals:   10/07/14 0803  BP: 120/74  Height: 5\' 3"  (1.6 m)  Weight: 131 lb (59.421 kg)   General appearance:  Normal affect, orientation and appearance. Skin: Grossly normal HEENT: Without gross lesions.  No cervical or supraclavicular adenopathy. Thyroid normal.  Lungs:  Clear without wheezing, rales or rhonchi Cardiac: RR, without RMG Abdominal:  Soft, nontender, without masses, guarding, rebound, organomegaly or hernia Breasts:  Examined lying and sitting without masses, retractions, discharge or axillary adenopathy. Pelvic:  Ext/BUS/vagina normal  Cervix normal.  Pap/HPV  Uterus anteverted, normal size, shape and contour, midline and mobile nontender   Adnexa  Without masses or tenderness    Anus and perineum  Normal   Rectovaginal  Normal sphincter tone without palpated masses or tenderness.    Assessment/Plan:  48 y.o. G52P1001 female for annual exam with mild menstrual irregularity, vasectomy birth control.   1. Menstrual irregularity. Patient notes that she will skip a month sporadically. Goes no more than 2 months without a menses. Otherwise has monthly menses. Last menses was very painful. Discussed possible hematometria status post her endometrial ablation. Recommend monitoring over the next cycle or 2. If significant pain persists with her periods she'll call and we'll schedule an ultrasound. Otherwise as long as her periods remain ultimately regular will monitor. Hormone level checks last year were normal to include a  FSH TSH and prolactin. 2. Pap 2012.  Pap smear/HPV today.  History of dysplasia and cryosurgery as a teenager with normal Pap smears since then. 3. Mammography 2013. She has a mammogram scheduled next week. Continue with annual mammography. SBE monthly reviewed. 4. Premenstrual bloating. Uses Dyazide for several days with good results. #30 with 2 refills provided. 5. Health maintenance. Baseline CBC, comprehensive metabolic panel, lipid profile, urinalysis ordered. Follow up in one year, sooner as needed.     Anastasio Auerbach MD, 8:23 AM 10/07/2014

## 2014-10-07 NOTE — Addendum Note (Signed)
Addended by: Nelva Nay on: 10/07/2014 08:45 AM   Modules accepted: Orders

## 2014-10-08 LAB — URINALYSIS W MICROSCOPIC + REFLEX CULTURE
Bacteria, UA: NONE SEEN
Bilirubin Urine: NEGATIVE
Casts: NONE SEEN
Crystals: NONE SEEN
GLUCOSE, UA: NEGATIVE mg/dL
Hgb urine dipstick: NEGATIVE
Ketones, ur: NEGATIVE mg/dL
Leukocytes, UA: NEGATIVE
NITRITE: NEGATIVE
Protein, ur: NEGATIVE mg/dL
SQUAMOUS EPITHELIAL / LPF: NONE SEEN
Specific Gravity, Urine: 1.013 (ref 1.005–1.030)
Urobilinogen, UA: 0.2 mg/dL (ref 0.0–1.0)
pH: 5.5 (ref 5.0–8.0)

## 2014-10-08 LAB — CYTOLOGY - PAP

## 2014-10-11 ENCOUNTER — Encounter: Payer: Self-pay | Admitting: Gynecology

## 2014-10-11 ENCOUNTER — Ambulatory Visit: Payer: Self-pay | Admitting: Internal Medicine

## 2015-01-18 ENCOUNTER — Telehealth: Payer: Self-pay | Admitting: *Deleted

## 2015-01-18 NOTE — Telephone Encounter (Signed)
Pt had ablation done in April 2013, called states her LMP:09/17/14 told to call if this should happen. Please advise

## 2015-01-18 NOTE — Telephone Encounter (Signed)
Recommend office visit

## 2015-01-19 NOTE — Telephone Encounter (Signed)
Left on pt voicemail to call front desk to schedule OV.

## 2015-02-01 ENCOUNTER — Telehealth: Payer: Self-pay | Admitting: *Deleted

## 2015-02-01 NOTE — Telephone Encounter (Signed)
Pt left message in traige asking if she should keep scheduled appointment on 02/03/15 due to no cycle since Oct 2015. Pt cycle started last night, I called pt back and told her yes to keep scheduled appointment.

## 2015-02-03 ENCOUNTER — Encounter: Payer: Self-pay | Admitting: Gynecology

## 2015-02-03 ENCOUNTER — Ambulatory Visit (INDEPENDENT_AMBULATORY_CARE_PROVIDER_SITE_OTHER): Payer: BLUE CROSS/BLUE SHIELD | Admitting: Gynecology

## 2015-02-03 VITALS — BP 120/76

## 2015-02-03 DIAGNOSIS — N926 Irregular menstruation, unspecified: Secondary | ICD-10-CM

## 2015-02-03 LAB — TSH: TSH: 2.544 u[IU]/mL (ref 0.350–4.500)

## 2015-02-03 NOTE — Progress Notes (Signed)
Kristine Foley January 09, 1966 [id number]        49 y.o.  G1P1001 presents with continued irregular bleeding.  Has been having issues with irregular bleeding of the past 2 years with skips in her cycle. Was having light regular menses status post HerOption endometrial ablation 2013.  Began having some irregularity 2014 with normal TSH FSH prolactin. Notes that this irregularity continue with skipping a month at a time but usually goes no more than 2 months without menses. LMP was in October and that she just started bleeding 2 days ago.  No prolonged or atypical bleeding no significant discomfort with her bleeding.  Past medical history,surgical history, problem list, medications, allergies, family history and social history were all reviewed and documented in the EPIC chart.  Directed ROS with pertinent positives and negatives documented in the history of present illness/assessment and plan.  Exam: Kim assistant General appearance:  Normal Abdomen soft nontender without masses guarding rebound Pelvic external BUS vagina normal with slight blood staining. Cervix normal. Uterus anteverted normal size midline mobile nontender. Adnexa without masses or tenderness.  Assessment/Plan:  49 y.o. G1P1001 with irregular bleeding status post endometrial ablation. Will recheck TSH Sargeant hCG for completeness noting vasectomy birth control. Start with sonohysterogram to look at the endometrial cavity recognizing the potential difficulty status post ablation. Various scenarios were reviewed with her to include intermittent progesterone withdrawal for regulation up to including low-dose oral contraceptives. If [place] elevated then plan observation at this point. She is without other significant symptoms such as hot flashes or night sweats.     Anastasio Auerbach MD, 12:39 PM 02/03/2015    2

## 2015-02-03 NOTE — Patient Instructions (Signed)
Follow-up for the ultrasound as scheduled. 

## 2015-02-04 LAB — FOLLICLE STIMULATING HORMONE: FSH: 10 m[IU]/mL

## 2015-02-04 LAB — HCG, QUANTITATIVE, PREGNANCY: hCG, Beta Chain, Quant, S: <2 m[IU]/mL

## 2015-02-08 ENCOUNTER — Other Ambulatory Visit: Payer: Self-pay | Admitting: Gynecology

## 2015-02-08 DIAGNOSIS — N926 Irregular menstruation, unspecified: Secondary | ICD-10-CM

## 2015-02-08 DIAGNOSIS — Z9889 Other specified postprocedural states: Secondary | ICD-10-CM

## 2015-02-23 ENCOUNTER — Encounter: Payer: Self-pay | Admitting: Gynecology

## 2015-02-23 ENCOUNTER — Ambulatory Visit (INDEPENDENT_AMBULATORY_CARE_PROVIDER_SITE_OTHER): Payer: BLUE CROSS/BLUE SHIELD | Admitting: Gynecology

## 2015-02-23 ENCOUNTER — Other Ambulatory Visit: Payer: Self-pay | Admitting: Gynecology

## 2015-02-23 ENCOUNTER — Ambulatory Visit (INDEPENDENT_AMBULATORY_CARE_PROVIDER_SITE_OTHER): Payer: BLUE CROSS/BLUE SHIELD

## 2015-02-23 VITALS — BP 120/60

## 2015-02-23 DIAGNOSIS — N831 Corpus luteum cyst of ovary, unspecified side: Secondary | ICD-10-CM

## 2015-02-23 DIAGNOSIS — N926 Irregular menstruation, unspecified: Secondary | ICD-10-CM | POA: Diagnosis not present

## 2015-02-23 DIAGNOSIS — N939 Abnormal uterine and vaginal bleeding, unspecified: Secondary | ICD-10-CM | POA: Diagnosis not present

## 2015-02-23 DIAGNOSIS — Z9889 Other specified postprocedural states: Secondary | ICD-10-CM

## 2015-02-23 NOTE — Progress Notes (Signed)
Kristine Foley [birth year] [id number]        49 y.o.  G1P1001 presents for sonohysterogram due to continued irregular bleeding. Patient has been having issues over the past 2 years with skips in her cycles. She was having regular light menses following her HerOption endometrial ablation in 2013. She then started skipping menses in 2014. She was evaluated February 2016 after not having a menses since October with heavy bleeding.  TSH FSH hCG were negative. Noting vasectomy for birth control. Patient notes that she has had a second menses since February.  Past medical history,surgical history, problem list, medications, allergies, family history and social history were all reviewed and documented in the EPIC chart.  Directed ROS with pertinent positives and negatives documented in the history of present illness/assessment and plan.  Exam: [place] assistant Filed Vitals:   02/23/15 1046  BP: 120/60   General appearance:  Normal Abdomen soft nontender without masses guarding rebound Pelvic external BUS vagina with slight staining. Cervix normal. Uterus normal size midline mobile nontender. Adnexa without masses or tenderness.  Ultrasound shows uterus normal size and echotexture. Endometrial echo 3.5 mm. Right ovary with thick-walled corpus luteal cyst 17 x 15 mm with peripheral flow. Left ovary is normal. Cul-de-sac negative.  Sonohysterogram performed, sterile technique, easy catheter introduction, some resistance to fluid introduction but good look at cavity which was empty. Endometrial sample taken. Patient tolerated well.  Assessment/Plan:  49 y.o. G1P1001 with irregular menses. Sonohysterogram negative. Some resistance to fluid input consistent with her history of endometrial ablation but was able to see the cavity adequately and it was normal. Patient will follow up for biopsy results. Options for irregular menses management were reviewed to include observation, hormonal manipulation,  hysterectomy. Do not feel Mirena IUD appropriate given her ablation and cavity resistance. Patient is not overly bothered by the irregularity and would prefer no intervention at this time. Will follow up for biopsy and assuming negative we'll plan observation. If significant atypical/prolonged bleeding or if she goes more than 2 months without bleeding she knows to call. Will plan progesterone intermittent withdrawals for less frequent menses.     Anastasio Auerbach MD, 10:56 AM 02/23/2015

## 2015-02-23 NOTE — Patient Instructions (Signed)
Office will call you with biopsy results. Call if your irregular bleeding becomes worse. Call if you go more than 2 months without a period. Call if you want to reconsider starting low-dose birth control pills

## 2015-07-20 ENCOUNTER — Other Ambulatory Visit: Payer: Self-pay

## 2015-07-20 DIAGNOSIS — Z1231 Encounter for screening mammogram for malignant neoplasm of breast: Secondary | ICD-10-CM

## 2015-10-12 ENCOUNTER — Ambulatory Visit (INDEPENDENT_AMBULATORY_CARE_PROVIDER_SITE_OTHER): Payer: BLUE CROSS/BLUE SHIELD | Admitting: Gynecology

## 2015-10-12 ENCOUNTER — Encounter: Payer: Self-pay | Admitting: Gynecology

## 2015-10-12 VITALS — BP 114/72 | Ht 63.0 in | Wt 131.0 lb

## 2015-10-12 DIAGNOSIS — Z01419 Encounter for gynecological examination (general) (routine) without abnormal findings: Secondary | ICD-10-CM | POA: Diagnosis not present

## 2015-10-12 LAB — CBC WITH DIFFERENTIAL/PLATELET
BASOS ABS: 0.1 10*3/uL (ref 0.0–0.1)
Basophils Relative: 1 % (ref 0–1)
EOS PCT: 4 % (ref 0–5)
Eosinophils Absolute: 0.2 10*3/uL (ref 0.0–0.7)
HEMATOCRIT: 40.6 % (ref 36.0–46.0)
HEMOGLOBIN: 13.5 g/dL (ref 12.0–15.0)
LYMPHS PCT: 28 % (ref 12–46)
Lymphs Abs: 1.6 10*3/uL (ref 0.7–4.0)
MCH: 30.4 pg (ref 26.0–34.0)
MCHC: 33.3 g/dL (ref 30.0–36.0)
MCV: 91.4 fL (ref 78.0–100.0)
MPV: 10.8 fL (ref 8.6–12.4)
Monocytes Absolute: 0.5 10*3/uL (ref 0.1–1.0)
Monocytes Relative: 9 % (ref 3–12)
NEUTROS ABS: 3.3 10*3/uL (ref 1.7–7.7)
NEUTROS PCT: 58 % (ref 43–77)
Platelets: 306 10*3/uL (ref 150–400)
RBC: 4.44 MIL/uL (ref 3.87–5.11)
RDW: 13.5 % (ref 11.5–15.5)
WBC: 5.7 10*3/uL (ref 4.0–10.5)

## 2015-10-12 LAB — COMPREHENSIVE METABOLIC PANEL
ALT: 11 U/L (ref 6–29)
AST: 12 U/L (ref 10–35)
Albumin: 4.2 g/dL (ref 3.6–5.1)
Alkaline Phosphatase: 57 U/L (ref 33–115)
BILIRUBIN TOTAL: 0.8 mg/dL (ref 0.2–1.2)
BUN: 17 mg/dL (ref 7–25)
CALCIUM: 9.1 mg/dL (ref 8.6–10.2)
CHLORIDE: 103 mmol/L (ref 98–110)
CO2: 27 mmol/L (ref 20–31)
Creat: 0.63 mg/dL (ref 0.50–1.10)
GLUCOSE: 98 mg/dL (ref 65–99)
Potassium: 4.3 mmol/L (ref 3.5–5.3)
Sodium: 136 mmol/L (ref 135–146)
Total Protein: 6.7 g/dL (ref 6.1–8.1)

## 2015-10-12 LAB — LIPID PANEL
CHOL/HDL RATIO: 3.4 ratio (ref <=5.0)
Cholesterol: 191 mg/dL (ref 125–200)
HDL: 57 mg/dL (ref >=46)
LDL CALC: 113 mg/dL (ref <130)
Triglycerides: 103 mg/dL (ref <150)
VLDL: 21 mg/dL (ref <30)

## 2015-10-12 MED ORDER — TRIAMTERENE-HCTZ 37.5-25 MG PO CAPS: 1.0000 | ORAL_CAPSULE | ORAL | Status: DC

## 2015-10-12 NOTE — Patient Instructions (Signed)

## 2015-10-12 NOTE — Addendum Note (Signed)
Addended by: Anastasio Auerbach on: 10/12/2015 08:50 AM   Modules accepted: Orders

## 2015-10-12 NOTE — Progress Notes (Addendum)
Kristine Foley 49 y.o. [id number]        49 y.o.  G1P1001  Patient's last menstrual period was 10/12/2015. for annual exam.  Doing well without complaints  Past medical history,surgical history, problem list, medications, allergies, family history and social history were all reviewed and documented as reviewed in the EPIC chart.  ROS:  Performed with pertinent positives and negatives included in the history, assessment and plan.   Additional significant findings :  none   Exam: Kim Counsellor Vitals:   10/12/15 0825  BP: 114/72  Height: 5\' 3"  (1.6 m)  Weight: 131 lb (59.421 kg)   General appearance:  Normal affect, orientation and appearance. Skin: Grossly normal HEENT: Without gross lesions.  No cervical or supraclavicular adenopathy. Thyroid normal.  Lungs:  Clear without wheezing, rales or rhonchi Cardiac: RR, without RMG Abdominal:  Soft, nontender, without masses, guarding, rebound, organomegaly or hernia Breasts:  Examined lying and sitting without masses, retractions, discharge or axillary adenopathy. Pelvic:  Ext/BUS/vagina with slight menses flow  Cervix normal  Uterus anteverted, normal size, shape and contour, midline and mobile nontender   Adnexa  Without masses or tenderness    Anus and perineum  Normal   Rectovaginal  Normal sphincter tone without palpated masses or tenderness.    Assessment/Plan:  49 y.o. G43P1001 female for annual exam with regular menses, vasectomy birth control.   1. Evaluation earlier this year for irregular menses negative. Menses have regulated and are now monthly. Continue to monitor and follow up if they become irregular again. 2. Mammography scheduled tomorrow. SBE monthly reviewed. 3. Pap smear/HPV 2015 negative. History of dysplasia as a teenager with normal Pap smears since then. 4. Health maintenance. Baseline CBC, comprehensive metabolic panel, lipid profile, urinalysis ordered. Follow up in a year, sooner as  needed.  Addendum: Patient after the visit forgot to ask me to refill her Dyazide which she uses several days each month for perimenstrual swelling. Dyazide No. 30 with 1 refill provided.    Anastasio Auerbach MD, 8:44 AM 10/12/2015

## 2015-10-13 LAB — URINALYSIS W MICROSCOPIC + REFLEX CULTURE
BILIRUBIN URINE: NEGATIVE
Bacteria, UA: NONE SEEN [HPF]
CASTS: NONE SEEN [LPF]
CRYSTALS: NONE SEEN [HPF]
Glucose, UA: NEGATIVE
Ketones, ur: NEGATIVE
Leukocytes, UA: NEGATIVE
Nitrite: NEGATIVE
PROTEIN: NEGATIVE
SPECIFIC GRAVITY, URINE: 1.017 (ref 1.001–1.035)
YEAST: NONE SEEN [HPF]
pH: 6 (ref 5.0–8.0)

## 2015-10-14 ENCOUNTER — Other Ambulatory Visit: Payer: Self-pay | Admitting: Gynecology

## 2015-10-14 MED ORDER — SULFAMETHOXAZOLE-TRIMETHOPRIM 800-160 MG PO TABS: 1.0000 | ORAL_TABLET | Freq: Two times a day (BID) | ORAL | Status: DC

## 2015-10-16 LAB — URINE CULTURE: Colony Count: 40000

## 2015-10-17 ENCOUNTER — Ambulatory Visit
Admission: RE | Admit: 2015-10-17 | Discharge: 2015-10-17 | Disposition: A | Discharge status: Home / Self Care | Payer: BLUE CROSS/BLUE SHIELD | Source: Ambulatory Visit

## 2015-10-17 DIAGNOSIS — Z1231 Encounter for screening mammogram for malignant neoplasm of breast: Secondary | ICD-10-CM

## 2015-12-30 ENCOUNTER — Telehealth: Payer: Self-pay | Admitting: *Deleted

## 2015-12-30 NOTE — Telephone Encounter (Signed)
Pt called c/o increased hot flashes and night sweats, no cycle since nov. 2016, I called pt back and told OV best with TF.

## 2016-07-03 ENCOUNTER — Other Ambulatory Visit: Payer: Self-pay | Admitting: Gynecology

## 2016-07-03 DIAGNOSIS — Z1231 Encounter for screening mammogram for malignant neoplasm of breast: Secondary | ICD-10-CM

## 2016-10-12 ENCOUNTER — Encounter: Payer: Self-pay | Admitting: Gynecology

## 2016-10-12 ENCOUNTER — Ambulatory Visit (INDEPENDENT_AMBULATORY_CARE_PROVIDER_SITE_OTHER): Payer: Managed Care, Other (non HMO) | Admitting: Gynecology

## 2016-10-12 VITALS — BP 118/76 | Ht 63.0 in | Wt 129.0 lb

## 2016-10-12 DIAGNOSIS — Z01419 Encounter for gynecological examination (general) (routine) without abnormal findings: Secondary | ICD-10-CM | POA: Diagnosis not present

## 2016-10-12 DIAGNOSIS — E559 Vitamin D deficiency, unspecified: Secondary | ICD-10-CM | POA: Diagnosis not present

## 2016-10-12 DIAGNOSIS — N926 Irregular menstruation, unspecified: Secondary | ICD-10-CM | POA: Diagnosis not present

## 2016-10-12 DIAGNOSIS — N951 Menopausal and female climacteric states: Secondary | ICD-10-CM | POA: Diagnosis not present

## 2016-10-12 DIAGNOSIS — Z13220 Encounter for screening for lipoid disorders: Secondary | ICD-10-CM

## 2016-10-12 LAB — CBC WITH DIFFERENTIAL/PLATELET
Basophils Absolute: 66 cells/uL (ref 0–200)
Basophils Relative: 1 %
Eosinophils Absolute: 198 cells/uL (ref 15–500)
Eosinophils Relative: 3 %
HEMATOCRIT: 39.5 % (ref 35.0–45.0)
Hemoglobin: 13.4 g/dL (ref 11.7–15.5)
LYMPHS PCT: 37 %
Lymphs Abs: 2442 cells/uL (ref 850–3900)
MCH: 30.7 pg (ref 27.0–33.0)
MCHC: 33.9 g/dL (ref 32.0–36.0)
MCV: 90.6 fL (ref 80.0–100.0)
MONO ABS: 528 {cells}/uL (ref 200–950)
MPV: 10.9 fL (ref 7.5–12.5)
Monocytes Relative: 8 %
NEUTROS PCT: 51 %
Neutro Abs: 3366 cells/uL (ref 1500–7800)
Platelets: 286 10*3/uL (ref 140–400)
RBC: 4.36 MIL/uL (ref 3.80–5.10)
RDW: 13.6 % (ref 11.0–15.0)
WBC: 6.6 10*3/uL (ref 3.8–10.8)

## 2016-10-12 NOTE — Progress Notes (Signed)
    Kristine Foley 03/07/1966 [id number]        50 y.o.  G1P1001  for annual exam.  Several issues noted below.  Past medical history,surgical history, problem list, medications, allergies, family history and social history were all reviewed and documented as reviewed in the EPIC chart.  ROS:  Performed with pertinent positives and negatives included in the history, assessment and plan.   Additional significant findings :  None   Exam: Caryn Bee assistant Vitals:   10/12/16 1401  BP: 118/76  Weight: 129 lb (58.5 kg)  Height: 5\' 3"  (1.6 m)   Body mass index is 22.85 kg/m.  General appearance:  Normal affect, orientation and appearance. Skin: Grossly normal HEENT: Without gross lesions.  No cervical or supraclavicular adenopathy. Thyroid normal.  Lungs:  Clear without wheezing, rales or rhonchi Cardiac: RR, without RMG Abdominal:  Soft, nontender, without masses, guarding, rebound, organomegaly or hernia Breasts:  Examined lying and sitting without masses, retractions, discharge or axillary adenopathy. Pelvic:  Ext, BUS, Vagina normal  Cervix normal  Uterus anteverted, normal size, shape and contour, midline and mobile nontender   Adnexa without masses or tenderness    Anus and perineum normal   Rectovaginal normal sphincter tone without palpated masses or tenderness.    Assessment/Plan:  50 y.o. G20P1001 female for annual exam with irregular menses, vasectomy birth control.   1. Irregular menses/menopausal symptoms. Patients menses have become irregular where she has had 2 menses this past year. Once in June and once in October. No bleeding in between. Has also started to have hot flushes and sweats. Had [facility] checked at her primary physician's office and reports that it returned in the 41s. I reviewed the. Menopausal timeframe reviewed with the patient and options for management to include observation, OTC products such as soy based, pharmacologic nonhormonal such as  Effexor and HRT. I reviewed the most recent NAMS 2017 guidelines on HRT with her to include the possible benefits of symptom relief, cardiovascular/bone health started early versus the risks to include thrombosis such as stroke heart attack DVT and breast cancer issue. At this point the patient does not feel it warrants treatment but will try OTC products. Will call if symptoms worsen and wants to rediscuss HRT. Will also keep menstrual calendar and as long as less frequent but regular menses when they occur will follow. If she goes more than 1 year without menses and then bleeds or has prolonged or atypical bleeding she knows to follow up with me. 2. Mammography scheduled next week. SBE monthly reviewed. 3. Pap smear/HPV 2015 negative. No Pap smear done today. Does have history of dysplasia as a teenager with normal Pap smears afterwards. 4. Health maintenance. Patient requests baseline labs. CBC, CMP, lipid profile, urinalysis ordered. Does note a low vitamin D in the past and is supplementing. We'll check vitamin D level also. Asked for refill of her Dyazide that she uses occasionally for swelling. #30 with one refill provided  Greater than 10 minutes of my time in excess of her routine gynecologic exam was spent in direct face to face counseling and coordination of care in regards to her perimenopausal symptoms of irregular bleeding hot flushes and night sweats, treatment options, risks/benefit discussion as far as HRT and review of the latest guidelines.    Anastasio Auerbach MD, 2:27 PM 10/12/2016

## 2016-10-12 NOTE — Patient Instructions (Signed)

## 2016-10-13 LAB — URINALYSIS W MICROSCOPIC + REFLEX CULTURE
Bacteria, UA: NONE SEEN [HPF]
Bilirubin Urine: NEGATIVE
CASTS: NONE SEEN [LPF]
Crystals: NONE SEEN [HPF]
Glucose, UA: NEGATIVE
Hgb urine dipstick: NEGATIVE
KETONES UR: NEGATIVE
Leukocytes, UA: NEGATIVE
NITRITE: NEGATIVE
PH: 7 (ref 5.0–8.0)
Protein, ur: NEGATIVE
RBC / HPF: NONE SEEN RBC/HPF (ref <=2)
SPECIFIC GRAVITY, URINE: 1.006 (ref 1.001–1.035)
Squamous Epithelial / LPF: NONE SEEN [HPF] (ref <=5)
WBC, UA: NONE SEEN WBC/HPF (ref <=5)
Yeast: NONE SEEN [HPF]

## 2016-10-13 LAB — COMPREHENSIVE METABOLIC PANEL
ALT: 14 U/L (ref 6–29)
AST: 15 U/L (ref 10–35)
Albumin: 4.3 g/dL (ref 3.6–5.1)
Alkaline Phosphatase: 62 U/L (ref 33–115)
BILIRUBIN TOTAL: 0.5 mg/dL (ref 0.2–1.2)
BUN: 15 mg/dL (ref 7–25)
CALCIUM: 9.2 mg/dL (ref 8.6–10.2)
CO2: 24 mmol/L (ref 20–31)
Chloride: 103 mmol/L (ref 98–110)
Creat: 0.78 mg/dL (ref 0.50–1.10)
GLUCOSE: 91 mg/dL (ref 65–99)
POTASSIUM: 4.5 mmol/L (ref 3.5–5.3)
Sodium: 138 mmol/L (ref 135–146)
Total Protein: 7 g/dL (ref 6.1–8.1)

## 2016-10-13 LAB — LIPID PANEL
CHOL/HDL RATIO: 3.4 ratio (ref <=5.0)
Cholesterol: 211 mg/dL — ABNORMAL HIGH (ref 125–200)
HDL: 62 mg/dL (ref >=46)
LDL CALC: 114 mg/dL (ref <130)
Triglycerides: 175 mg/dL — ABNORMAL HIGH (ref <150)
VLDL: 35 mg/dL — AB (ref <30)

## 2016-10-13 LAB — VITAMIN D 25 HYDROXY (VIT D DEFICIENCY, FRACTURES): Vit D, 25-Hydroxy: 35 ng/mL (ref 30–100)

## 2016-10-15 ENCOUNTER — Other Ambulatory Visit: Payer: Self-pay

## 2016-10-29 ENCOUNTER — Ambulatory Visit
Admission: RE | Admit: 2016-10-29 | Discharge: 2016-10-29 | Disposition: A | Discharge status: Home / Self Care | Payer: Managed Care, Other (non HMO) | Source: Ambulatory Visit | Attending: Gynecology | Admitting: Gynecology

## 2016-10-29 DIAGNOSIS — Z1231 Encounter for screening mammogram for malignant neoplasm of breast: Secondary | ICD-10-CM

## 2016-11-05 ENCOUNTER — Other Ambulatory Visit: Payer: Self-pay | Admitting: Gynecology

## 2016-11-05 DIAGNOSIS — R928 Other abnormal and inconclusive findings on diagnostic imaging of breast: Secondary | ICD-10-CM

## 2016-11-12 ENCOUNTER — Other Ambulatory Visit: Payer: Managed Care, Other (non HMO)

## 2016-11-14 ENCOUNTER — Ambulatory Visit
Admission: RE | Admit: 2016-11-14 | Discharge: 2016-11-14 | Disposition: A | Discharge status: Home / Self Care | Payer: Managed Care, Other (non HMO) | Source: Ambulatory Visit | Attending: Gynecology | Admitting: Gynecology

## 2016-11-14 DIAGNOSIS — R928 Other abnormal and inconclusive findings on diagnostic imaging of breast: Secondary | ICD-10-CM

## 2016-12-18 ENCOUNTER — Other Ambulatory Visit: Payer: Self-pay | Admitting: Gynecology

## 2017-08-15 ENCOUNTER — Other Ambulatory Visit: Payer: Self-pay | Admitting: Gynecology

## 2017-08-15 DIAGNOSIS — Z1231 Encounter for screening mammogram for malignant neoplasm of breast: Secondary | ICD-10-CM

## 2017-10-14 ENCOUNTER — Encounter: Payer: Self-pay | Admitting: Gynecology

## 2017-10-14 ENCOUNTER — Ambulatory Visit (INDEPENDENT_AMBULATORY_CARE_PROVIDER_SITE_OTHER): Payer: 59 | Admitting: Gynecology

## 2017-10-14 VITALS — BP 118/76 | Ht 63.0 in | Wt 142.0 lb

## 2017-10-14 DIAGNOSIS — Z01419 Encounter for gynecological examination (general) (routine) without abnormal findings: Secondary | ICD-10-CM | POA: Diagnosis not present

## 2017-10-14 DIAGNOSIS — N951 Menopausal and female climacteric states: Secondary | ICD-10-CM

## 2017-10-14 NOTE — Progress Notes (Signed)
    Kristine Foley 1966/07/31 [id number]        51 y.o.  G1P1001 for annual gynecologic exam.  Has been having hot flushes and night sweats this past year.  No menses or bleeding.  Apparently tried HRT through her primary physician's office but discontinued that she felt uncomfortable with this.  Notes that her hot flashes seem to be getting better.  Past medical history,surgical history, problem list, medications, allergies, family history and social history were all reviewed and documented as reviewed in the EPIC chart.  ROS:  Performed with pertinent positives and negatives included in the history, assessment and plan.   Additional significant findings : None   Exam: Caryn Bee assistant Vitals:   10/14/17 1400  BP: 118/76  Weight: 142 lb (64.4 kg)  Height: 5\' 3"  (1.6 m)   Body mass index is 25.15 kg/m.  General appearance:  Normal affect, orientation and appearance. Skin: Grossly normal HEENT: Without gross lesions.  No cervical or supraclavicular adenopathy. Thyroid normal.  Lungs:  Clear without wheezing, rales or rhonchi Cardiac: RR, without RMG Abdominal:  Soft, nontender, without masses, guarding, rebound, organomegaly or hernia Breasts:  Examined lying and sitting without masses, retractions, discharge or axillary adenopathy. Pelvic:  Ext, BUS, Vagina: Normal  Cervix: Normal your done  Uterus: Anteverted, normal size, shape and contour, midline and mobile nontender   Adnexa: Without masses or tenderness    Anus and perineum: Normal   Rectovaginal: Normal sphincter tone without palpated masses or tenderness.    Assessment/Plan:  51 y.o. G33P1001 female for annual gynecologic exam.   1. Postmenopausal.  Having some hot flushes and sweats.  Is not interested in taking any medication for these.  Will monitor for now.  Will follow-up if they significantly worsen and she wants to rediscuss treatment options.  No bleeding.  No significant vaginal  dryness. 2. Mammography due this December and I reminded her to schedule this.  Breast exam normal today.  SBE monthly reviewed. 3. Pap smear/HPV 2015.  Patient uncomfortable with current screening guidelines and prefers Pap smear this year.  Pap smear done.  History of dysplasia as a teenager with normal Pap smears since. 4. Colonoscopy 2018.  Repeat at their recommended interval. 5. Health maintenance.  Has appointment for routine blood work at her primary 72 office in several weeks.  We will follow-up in 1 year, sooner as needed.   Anastasio Auerbach MD, 2:30 PM 10/14/2017

## 2017-10-14 NOTE — Patient Instructions (Signed)
Follow-up in 1 year for annual exam, sooner as needed. 

## 2017-10-14 NOTE — Addendum Note (Signed)
Addended by: Nelva Nay on: 10/14/2017 03:32 PM   Modules accepted: Orders

## 2017-10-16 LAB — PAP IG W/ RFLX HPV ASCU

## 2017-11-04 ENCOUNTER — Ambulatory Visit
Admission: RE | Admit: 2017-11-04 | Discharge: 2017-11-04 | Disposition: A | Discharge status: Home / Self Care | Payer: Managed Care, Other (non HMO) | Source: Ambulatory Visit | Attending: Gynecology | Admitting: Gynecology

## 2017-11-04 DIAGNOSIS — Z1231 Encounter for screening mammogram for malignant neoplasm of breast: Secondary | ICD-10-CM

## 2017-11-06 ENCOUNTER — Other Ambulatory Visit: Payer: Self-pay | Admitting: Gynecology

## 2017-11-06 DIAGNOSIS — R928 Other abnormal and inconclusive findings on diagnostic imaging of breast: Secondary | ICD-10-CM

## 2017-11-11 ENCOUNTER — Ambulatory Visit
Admission: RE | Admit: 2017-11-11 | Discharge: 2017-11-11 | Disposition: A | Discharge status: Home / Self Care | Payer: Managed Care, Other (non HMO) | Source: Ambulatory Visit | Attending: Gynecology | Admitting: Gynecology

## 2017-11-11 ENCOUNTER — Other Ambulatory Visit: Payer: Self-pay | Admitting: Gynecology

## 2017-11-11 DIAGNOSIS — N6310 Unspecified lump in the right breast, unspecified quadrant: Secondary | ICD-10-CM

## 2017-11-11 DIAGNOSIS — R928 Other abnormal and inconclusive findings on diagnostic imaging of breast: Secondary | ICD-10-CM

## 2017-11-14 ENCOUNTER — Other Ambulatory Visit: Payer: Self-pay | Admitting: Gynecology

## 2017-11-14 ENCOUNTER — Ambulatory Visit
Admission: RE | Admit: 2017-11-14 | Discharge: 2017-11-14 | Disposition: A | Discharge status: Home / Self Care | Payer: Managed Care, Other (non HMO) | Source: Ambulatory Visit | Attending: Gynecology | Admitting: Gynecology

## 2017-11-14 DIAGNOSIS — N6310 Unspecified lump in the right breast, unspecified quadrant: Secondary | ICD-10-CM

## 2018-03-19 ENCOUNTER — Other Ambulatory Visit: Payer: Self-pay | Admitting: Gynecology

## 2018-09-16 ENCOUNTER — Other Ambulatory Visit: Payer: Self-pay | Admitting: Obstetrics & Gynecology

## 2018-09-16 DIAGNOSIS — Z1231 Encounter for screening mammogram for malignant neoplasm of breast: Secondary | ICD-10-CM

## 2018-10-15 ENCOUNTER — Encounter: Payer: 59 | Admitting: Gynecology

## 2018-11-10 ENCOUNTER — Ambulatory Visit
Admission: RE | Admit: 2018-11-10 | Discharge: 2018-11-10 | Disposition: A | Discharge status: Home / Self Care | Payer: 59 | Source: Ambulatory Visit | Attending: Obstetrics & Gynecology | Admitting: Obstetrics & Gynecology

## 2018-11-10 DIAGNOSIS — Z1231 Encounter for screening mammogram for malignant neoplasm of breast: Secondary | ICD-10-CM

## 2019-08-26 ENCOUNTER — Other Ambulatory Visit: Payer: Self-pay | Admitting: Obstetrics & Gynecology

## 2019-08-26 DIAGNOSIS — Z1231 Encounter for screening mammogram for malignant neoplasm of breast: Secondary | ICD-10-CM

## 2019-09-02 ENCOUNTER — Encounter: Payer: Self-pay | Admitting: Gynecology

## 2019-09-22 IMAGING — MG DIGITAL SCREENING BILATERAL MAMMOGRAM WITH TOMO AND CAD
8 series · 9 of 24 positions shown · non-contrast
Comparison: Previous exam(s).

CLINICAL DATA: Screening.

EXAM:
DIGITAL SCREENING BILATERAL MAMMOGRAM WITH TOMO AND CAD

[R CC synth-2D]
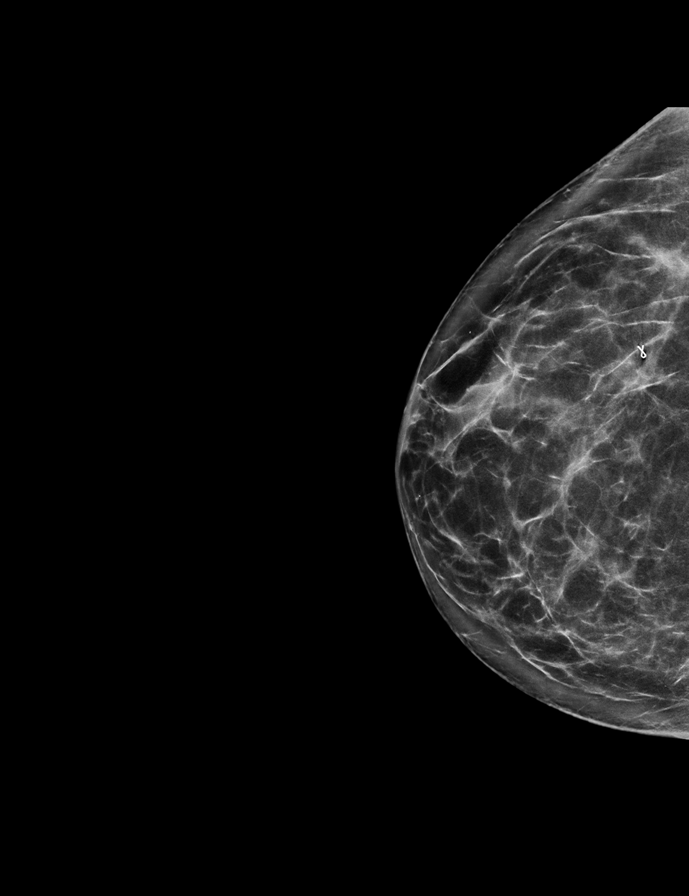

[L MLO synth-2D]
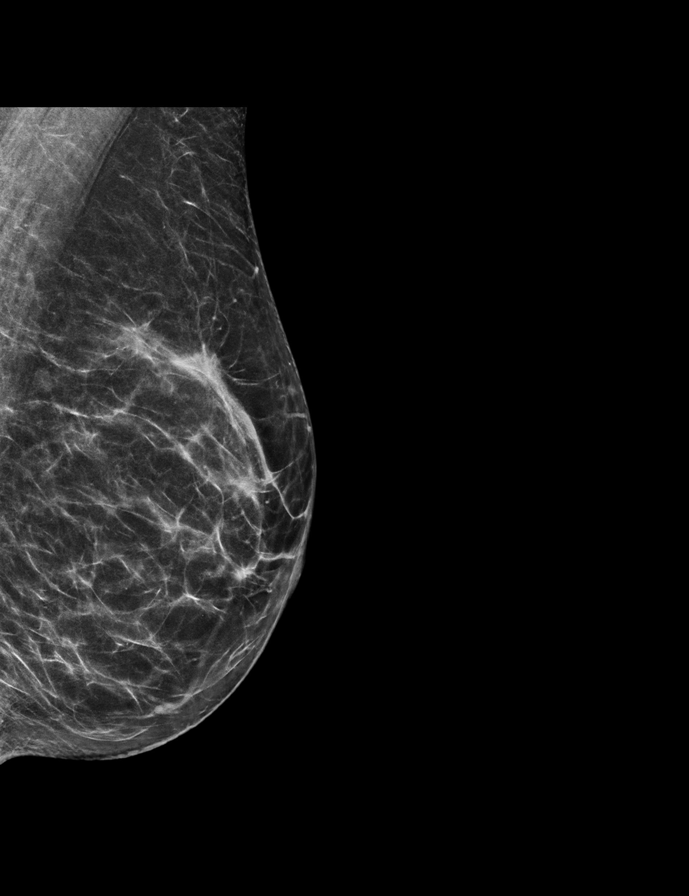

[L CC synth-2D]
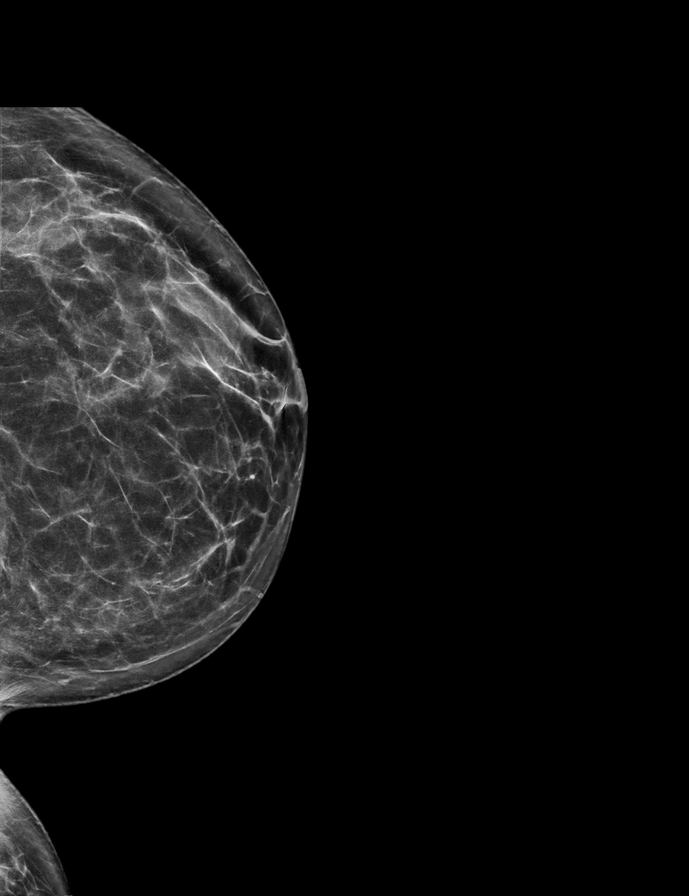

[R MLO synth-2D]
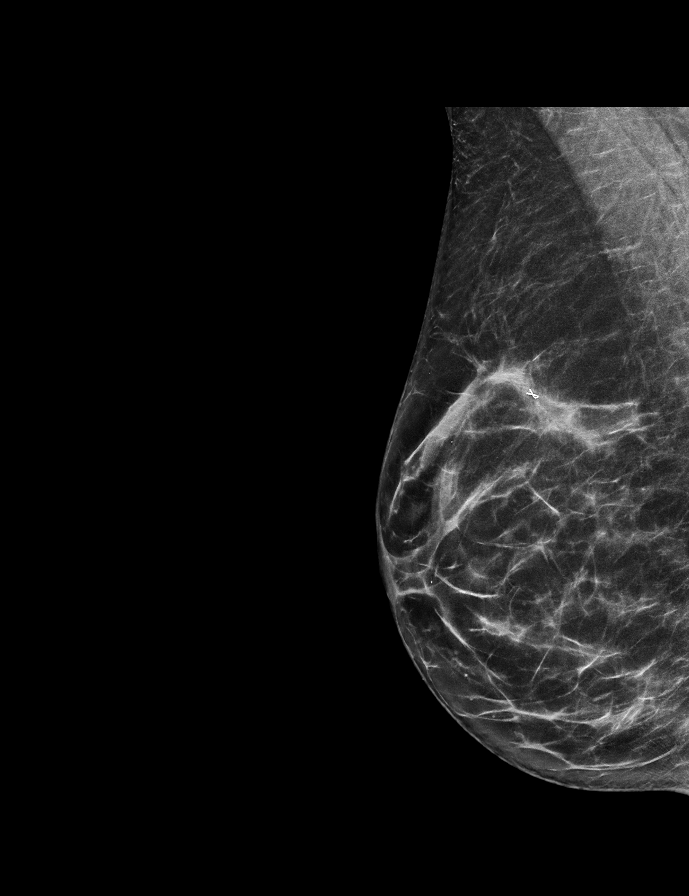

[R CC tomo · 2 of 71 frames shown]
[frame 23/71]
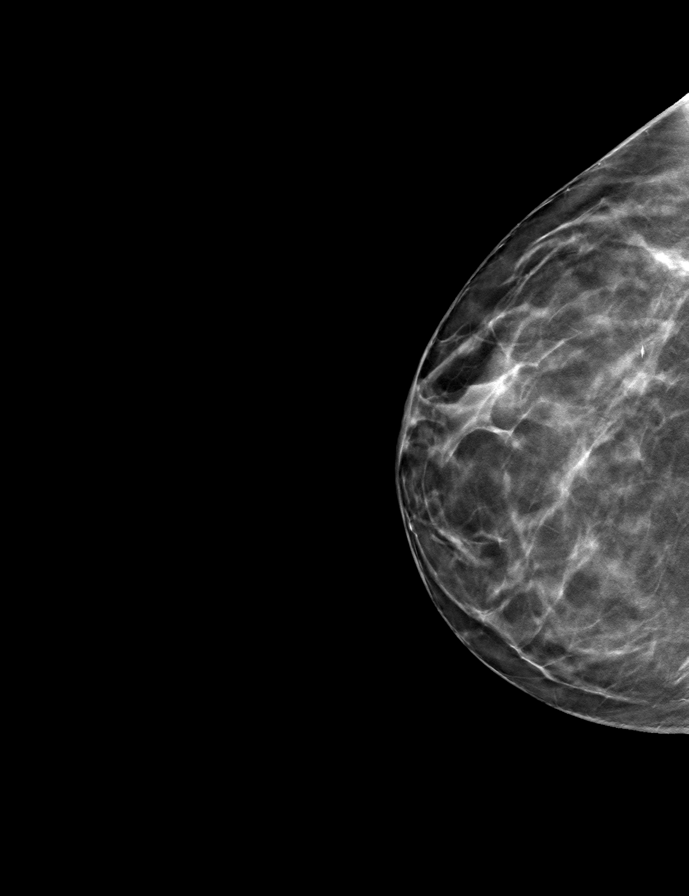
[frame 36/71]
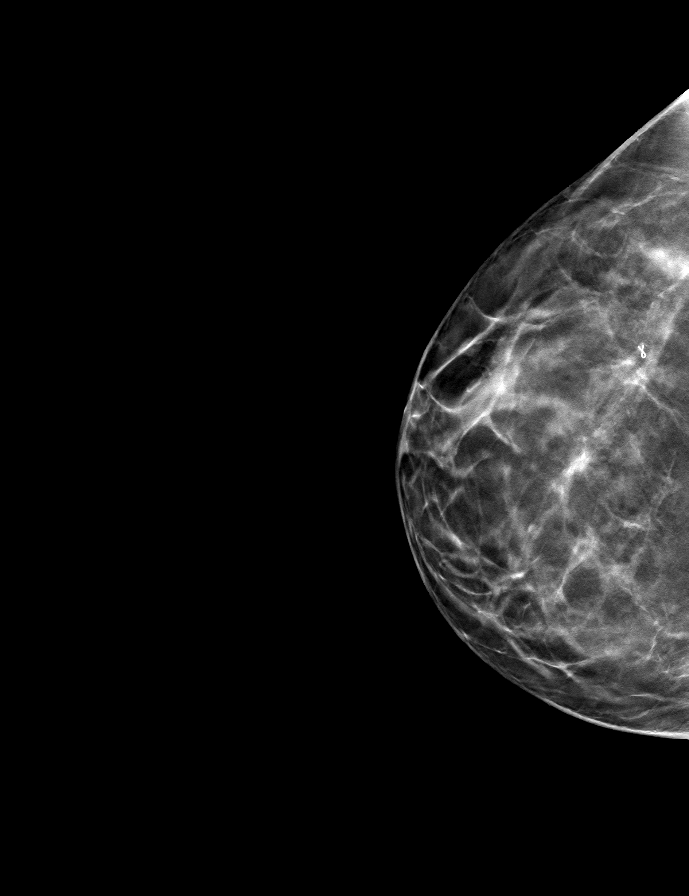

[L MLO tomo · tomo slice 33/65.0]
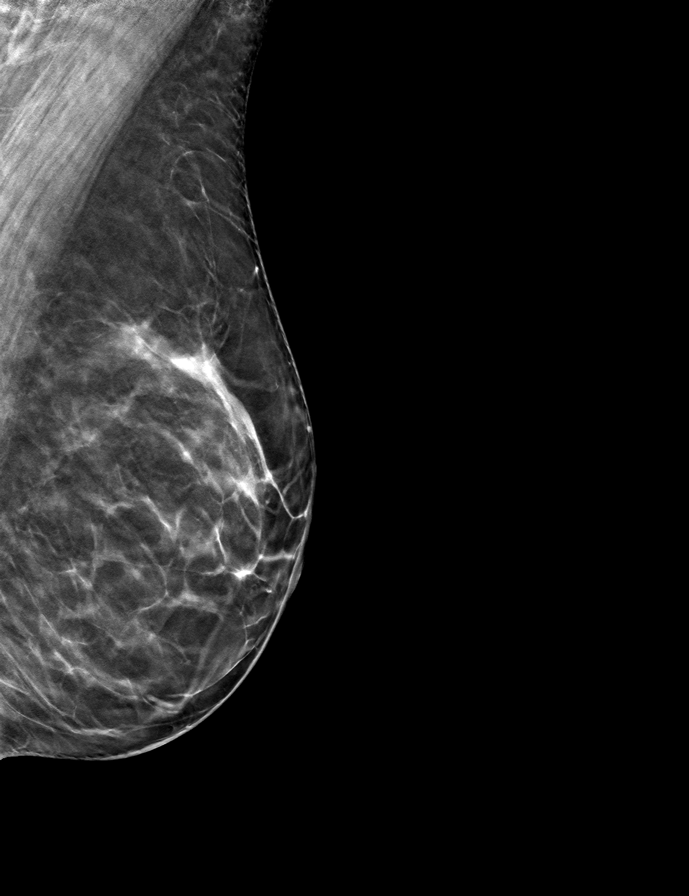

[L CC tomo · tomo slice 35/68.0]
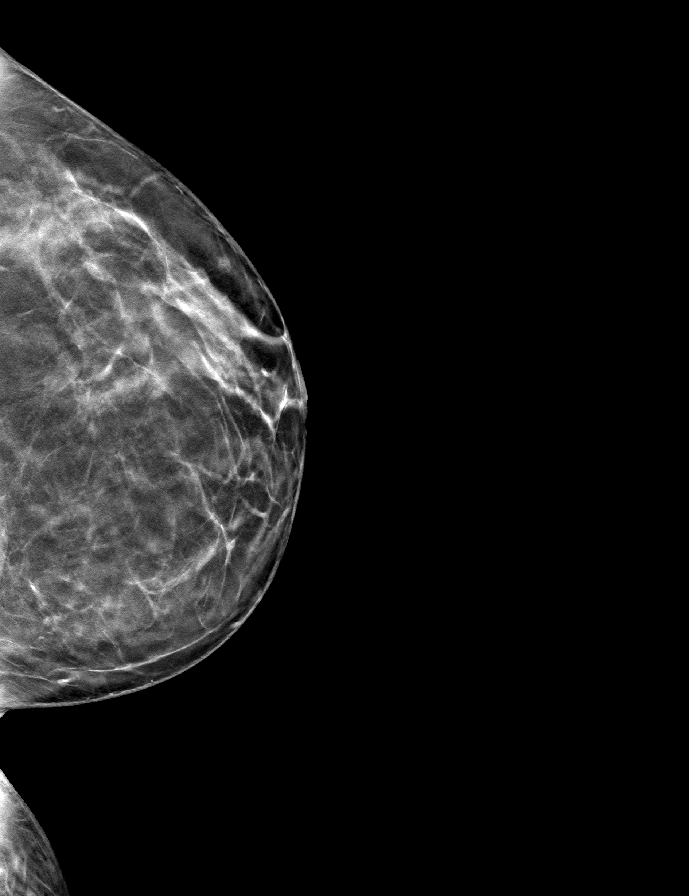

[R MLO tomo · tomo slice 35/68.0]
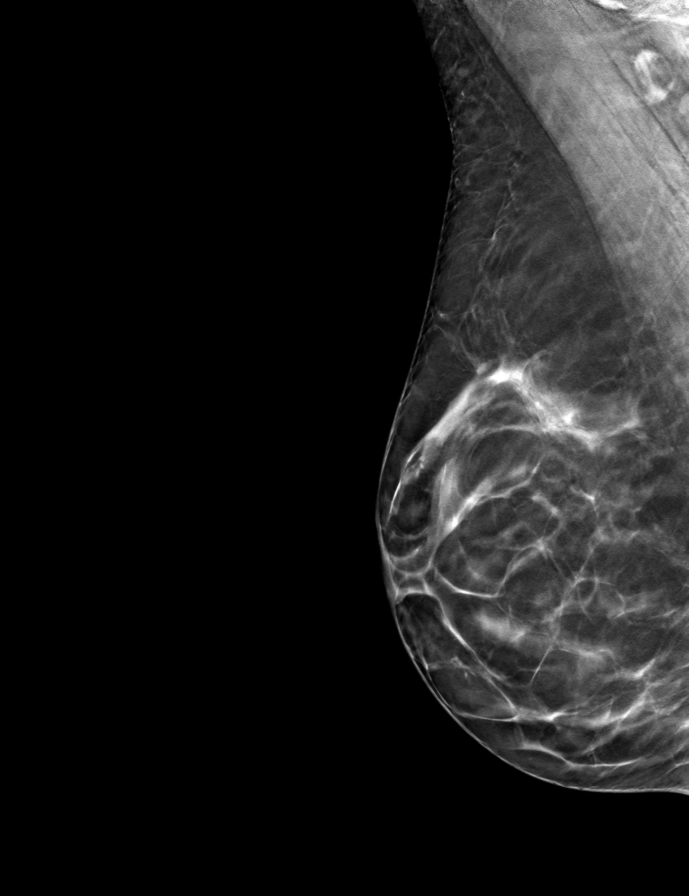

[9 of 24 positions shown; findings below may reference images not displayed]

ACR Breast Density Category b: There are scattered areas of
fibroglandular density.
FINDINGS: There are no findings suspicious for malignancy. Images were
processed with CAD.
IMPRESSION: No mammographic evidence of malignancy. A result letter of this
screening mammogram will be mailed directly to the patient.

RECOMMENDATION:
Screening mammogram in one year. (Code:[id number])

BI-RADS CATEGORY  1: Negative.

## 2019-11-13 ENCOUNTER — Ambulatory Visit
Admission: RE | Admit: 2019-11-13 | Discharge: 2019-11-13 | Disposition: A | Discharge status: Home / Self Care | Payer: 59 | Source: Ambulatory Visit | Attending: Obstetrics & Gynecology | Admitting: Obstetrics & Gynecology

## 2019-11-13 ENCOUNTER — Other Ambulatory Visit: Payer: Self-pay

## 2019-11-13 DIAGNOSIS — Z1231 Encounter for screening mammogram for malignant neoplasm of breast: Secondary | ICD-10-CM

## 2020-09-23 ENCOUNTER — Other Ambulatory Visit: Payer: Self-pay | Admitting: Obstetrics & Gynecology

## 2020-09-23 DIAGNOSIS — Z1231 Encounter for screening mammogram for malignant neoplasm of breast: Secondary | ICD-10-CM

## 2020-11-18 ENCOUNTER — Ambulatory Visit
Admission: RE | Admit: 2020-11-18 | Discharge: 2020-11-18 | Disposition: A | Discharge status: Home / Self Care | Payer: 59 | Source: Ambulatory Visit | Attending: Obstetrics & Gynecology | Admitting: Obstetrics & Gynecology

## 2020-11-18 ENCOUNTER — Other Ambulatory Visit: Payer: Self-pay

## 2020-11-18 DIAGNOSIS — Z1231 Encounter for screening mammogram for malignant neoplasm of breast: Secondary | ICD-10-CM

## 2021-08-25 ENCOUNTER — Other Ambulatory Visit: Payer: Self-pay | Admitting: Internal Medicine

## 2021-08-25 DIAGNOSIS — Z1231 Encounter for screening mammogram for malignant neoplasm of breast: Secondary | ICD-10-CM

## 2021-11-20 ENCOUNTER — Other Ambulatory Visit: Payer: Self-pay

## 2021-11-20 ENCOUNTER — Ambulatory Visit
Admission: RE | Admit: 2021-11-20 | Discharge: 2021-11-20 | Disposition: A | Discharge status: Home / Self Care | Payer: Managed Care, Other (non HMO) | Source: Ambulatory Visit | Attending: Internal Medicine | Admitting: Internal Medicine

## 2021-11-20 DIAGNOSIS — Z1231 Encounter for screening mammogram for malignant neoplasm of breast: Secondary | ICD-10-CM

## 2022-08-29 ENCOUNTER — Ambulatory Visit: Admit: 2022-08-29 | Payer: Managed Care, Other (non HMO) | Admitting: Gastroenterology

## 2022-08-29 SURGERY — COLONOSCOPY
Anesthesia: General

## 2022-09-14 ENCOUNTER — Other Ambulatory Visit: Payer: Self-pay | Admitting: Internal Medicine

## 2022-09-14 DIAGNOSIS — Z1231 Encounter for screening mammogram for malignant neoplasm of breast: Secondary | ICD-10-CM

## 2022-11-22 ENCOUNTER — Ambulatory Visit
Admission: RE | Admit: 2022-11-22 | Discharge: 2022-11-22 | Disposition: A | Discharge status: Home / Self Care | Payer: Managed Care, Other (non HMO) | Source: Ambulatory Visit | Attending: Internal Medicine | Admitting: Internal Medicine

## 2022-11-22 DIAGNOSIS — Z1231 Encounter for screening mammogram for malignant neoplasm of breast: Secondary | ICD-10-CM

## 2023-08-16 ENCOUNTER — Other Ambulatory Visit: Payer: Self-pay | Admitting: Internal Medicine

## 2023-08-16 DIAGNOSIS — Z1231 Encounter for screening mammogram for malignant neoplasm of breast: Secondary | ICD-10-CM

## 2023-09-03 ENCOUNTER — Encounter: Payer: Self-pay | Admitting: Licensed Clinical Social Worker

## 2023-09-03 ENCOUNTER — Inpatient Hospital Stay: Payer: Managed Care, Other (non HMO)

## 2023-09-03 ENCOUNTER — Inpatient Hospital Stay: Payer: Managed Care, Other (non HMO) | Attending: Oncology | Admitting: Licensed Clinical Social Worker

## 2023-09-03 DIAGNOSIS — Z800 Family history of malignant neoplasm of digestive organs: Secondary | ICD-10-CM

## 2023-09-03 DIAGNOSIS — Z8041 Family history of malignant neoplasm of ovary: Secondary | ICD-10-CM

## 2023-09-03 NOTE — Progress Notes (Signed)
REFERRING PROVIDER: Christeen Douglas, MD [address] [place],  [place] [address]  PRIMARY PROVIDER:  Lauro Regulus, MD  PRIMARY REASON FOR VISIT:  1. Family history of colon cancer   2. Family history of ovarian cancer      HISTORY OF PRESENT ILLNESS:   Ms. Spotts, a 57 y.o. female, was seen for a [facility] cancer genetics consultation at the request of Dr. Dalbert Garnet due to a family history of cancer.  Ms. Tschida presents to clinic today to discuss the possibility of a hereditary predisposition to cancer, genetic testing, and to further clarify her future cancer risks, as well as potential cancer risks for family members.    CANCER HISTORY:  Ms. Leibovitz is a 57 y.o. 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  RISK FACTORS:  Menarche was at age 72.  First live birth at age 80.  Ovaries intact: yes.  Hysterectomy: no.  Menopausal status: perimenopausal.  Colonoscopy: yes;  every 5 years. Less than 10 polyps . Mammogram within the last year: yes. Number of breast biopsies: 1. Up to date with pelvic exams: yes.  Past Medical History:  Diagnosis Date   Cancer (HCC) 2010   Basal cell   Cervical dysplasia As teenager   Normal Pap smears since    Past Surgical History:  Procedure Laterality Date   basal cell excised     Birthmark excised     CESAREAN SECTION     CHOLECYSTECTOMY     GYNECOLOGIC CRYOSURGERY  As teenager   HER OPTION ABLATION  03/2012   plantar warts     TONSILLECTOMY      FAMILY HISTORY:  We obtained a detailed, 4-generation family history.  Significant diagnoses are listed below: Family History  Problem Relation Age of Onset   Hypertension Mother    Ovarian cancer Mother 60       in uterus as well; hysterectomy   Diabetes Mother    Hypertension Father    Colon cancer Father        dx 28s   Rheum arthritis Father    Prostate cancer Father        dx 71s   Lupus Sister    Hypertension Maternal Grandmother    Diabetes  Maternal Grandmother    Cancer Maternal Grandmother        sinus/leukemia/skin    Ms. Idrovo has 1 daughter, Leeroy Bock, 71, no cancers. She has 1 full sister, 1 paternal half sister, 1 paternal half brother, 2 maternal half brothers, 1 maternal half sister, no cancers.  Ms. Scahill's mother had ovarian cancer at 26 and had hysterectomy, cancer was also in her uterus. She is living at 27 and reportedly had normal genetic testing about 13 years ago but does not have access to the report. She also would not want to test again. Maternal grandmother had skin cancer and another cancer, possibly sinus cancer/leukemia/or a different rare cancer.  Ms. Mangiapane's father had colon and prostate cancer in his 64s and passed at 44. No other known cancers on this side of the family.  Ms. Lamond is unaware of previous family history of genetic testing for hereditary cancer risks. There is no reported Ashkenazi Jewish ancestry. There is no known consanguinity.  GENETIC COUNSELING ASSESSMENT: Ms. Levitt is a 57 y.o. female with a family history of colon and uterine cancer which is somewhat suggestive of a hereditary cancer syndrome and predisposition to cancer. We, therefore, discussed and recommended the following  at today's visit.   DISCUSSION: We discussed that approximately 10% of cancer is hereditary. Most cases of hereditary colon cancer are associated with Lynch syndrome genes, although there are other genes associated with hereditary cancer as well. Cancers and risks are gene specific. We discussed that testing is beneficial for several reasons including knowing about cancer risks, identifying potential screening and risk-reduction options that may be appropriate, and to understand if other family members could be at risk for cancer and allow them to undergo genetic testing.   We reviewed the characteristics, features and inheritance patterns of hereditary cancer syndromes. We also discussed genetic  testing, including the appropriate family members to test, the process of testing, insurance coverage and turn-around-time for results. We discussed the implications of a negative, positive and/or variant of uncertain significant result. We recommended Ms. Going pursue genetic testing for the [facility] CancerNext+RNA gene panel.   Based on Ms. Mosey's family history of cancer, she meets medical criteria for genetic testing. Despite that she meets criteria, she may still have an out of pocket cost.     PLAN: After considering the risks, benefits, and limitations, Ms. Alcalde provided informed consent to pursue genetic testing and the blood sample was sent to [facility] for analysis of the CancerNextExpanded+RNA panel. Results should be available within approximately 2-3 weeks' time, at which point they will be disclosed by telephone to Ms. Thornhill, as will any additional recommendations warranted by these results. Ms. Thede will receive a summary of her genetic counseling visit and a copy of her results once available. This information will also be available in Epic.   Ms. Renzulli's questions were answered to her satisfaction today. Our contact information was provided should additional questions or concerns arise. Thank you for the referral and allowing [place] to share in the care of your patient.   Lacy Duverney, MS, [facility] Genetic Counselor [email or web address] Phone: [phone]  The patient was seen for a total of 30 minutes in face-to-face genetic counseling.  Patient's daughter Leeroy Bock was also present. Dr. Blake Divine was available for discussion regarding this case.   _______________________________________________________________________ For Office Staff:  Number of people involved in session: 2 Was an Intern/ student involved with case: no

## 2023-09-26 ENCOUNTER — Encounter: Payer: Self-pay | Admitting: Licensed Clinical Social Worker

## 2023-09-26 ENCOUNTER — Telehealth: Payer: Self-pay | Admitting: Licensed Clinical Social Worker

## 2023-09-26 ENCOUNTER — Ambulatory Visit: Payer: Self-pay | Admitting: Licensed Clinical Social Worker

## 2023-09-26 DIAGNOSIS — Z1379 Encounter for other screening for genetic and chromosomal anomalies: Secondary | ICD-10-CM | POA: Insufficient documentation

## 2023-09-26 NOTE — Progress Notes (Signed)
HPI:   Ms. Printup was previously seen in the [facility] clinic due to a family history of cancer and concerns regarding a hereditary predisposition to cancer. Please refer to our prior cancer genetics clinic note for more information regarding our discussion, assessment and recommendations, at the time. Ms. Turan's recent genetic test results were disclosed to her, as were recommendations warranted by these results. These results and recommendations are discussed in more detail below.  CANCER HISTORY:  Oncology History   No history exists.    FAMILY HISTORY:  We obtained a detailed, 4-generation family history.  Significant diagnoses are listed below: Family History  Problem Relation Age of Onset   Hypertension Mother    Ovarian cancer Mother 59       in uterus as well; hysterectomy   Diabetes Mother    Hypertension Father    Colon cancer Father        dx 41s   Rheum arthritis Father    Prostate cancer Father        dx 24s   Lupus Sister    Hypertension Maternal Grandmother    Diabetes Maternal Grandmother    Cancer Maternal Grandmother        sinus/leukemia/skin   Ms. Reede has 1 daughter, Leeroy Bock, 22, no cancers. She has 1 full sister, 1 paternal half sister, 1 paternal half brother, 2 maternal half brothers, 1 maternal half sister, no cancers.   Ms. Vivero's mother had ovarian cancer at 66 and had hysterectomy, cancer was also in her uterus. She is living at 34 and reportedly had normal genetic testing about 13 years ago but does not have access to the report. She also would not want to test again. Maternal grandmother had skin cancer and another cancer, possibly sinus cancer/leukemia/or a different rare cancer.   Ms. Sherrin's father had colon and prostate cancer in his 13s and passed at 44. No other known cancers on this side of the family.   Ms. Tuckett is unaware of previous family history of genetic testing for hereditary cancer risks. There is  no reported Ashkenazi Jewish ancestry. There is no known consanguinity.   GENETIC TEST RESULTS:  The [facility] CancerNext-Expanded+RNA Panel found no pathogenic mutations.   The CancerNext-Expanded + RNAinsight gene panel offered by [employer] and includes sequencing and rearrangement analysis for the following 71 genes: AIP, ALK, APC*, ATM*, AXIN2, BAP1, BARD1, BMPR1A, BRCA1*, BRCA2*, BRIP1*, CDC73, CDH1*,CDK4, CDKN1B, CDKN2A, CHEK2*, CTNNA1, DICER1, FH, FLCN, KIF1B, LZTR1, MAX, MEN1, MET, MLH1*, MSH2*, MSH3, MSH6*, MUTYH*, NF1*, NF2, NTHL1, PALB2*, PHOX2B, PMS2*, POT1, PRKAR1A, PTCH1, PTEN*, RAD51C*, RAD51D*,RB1, RET, SDHA, SDHAF2, SDHB, SDHC, SDHD, SMAD4, SMARCA4, SMARCB1, SMARCE1, STK11, SUFU, TMEM127, TP53*,TSC1, TSC2, VHL; EGFR, EGLN1, HOXB13, KIT, MITF, PDGFRA, POLD1 and POLE (sequencing only); EPCAM and GREM1 (deletion/duplication only).   The test report has been scanned into EPIC and is located under the Molecular Pathology section of the Results Review tab.  A portion of the result report is included below for reference. Genetic testing reported out on 09/25/2023.      Even though a pathogenic variant was not identified, possible explanations for the cancer in the family may include: There may be no hereditary risk for cancer in the family. The cancers in Ms. Flythe and/or her family may be sporadic/familial or due to other genetic and environmental factors. There may be a gene mutation in one of these genes that current testing methods cannot detect but that chance is small. There could be another gene  that has not yet been discovered, or that we have not yet tested, that is responsible for the cancer diagnoses in the family.  It is also possible there is a hereditary cause for the cancer in the family that Ms. Shaikh did not inherit.  Therefore, it is important to remain in touch with cancer genetics in the future so that we can continue to offer Ms. Holte the most up to date  genetic testing.   ADDITIONAL GENETIC TESTING:  We discussed with Ms. Bently that her genetic testing was fairly extensive.  If there are additional relevant genes identified to increase cancer risk that can be analyzed in the future, we would be happy to discuss and coordinate this testing at that time.    CANCER SCREENING RECOMMENDATIONS:  Ms. Callies's test result is considered negative (normal).  This means that we have not identified a hereditary cause for her family history of cancer at this time.   An individual's cancer risk and medical management are not determined by genetic test results alone. Overall cancer risk assessment incorporates additional factors, including personal medical history, family history, and any available genetic information that may result in a personalized plan for cancer prevention and surveillance. Therefore, it is recommended she continue to follow the cancer management and screening guidelines provided by her primary healthcare provider.  RECOMMENDATIONS FOR FAMILY MEMBERS:   Since she did not inherit a identifiable mutation in a cancer predisposition gene included on this panel, her children could not have inherited a known mutation from her in one of these genes. Individuals in this family might be at some increased risk of developing cancer, over the general population risk, due to the family history of cancer.  Individuals in the family should notify their providers of the family history of cancer. We recommend women in this family have a yearly mammogram beginning at age 43, or 29 years younger than the earliest onset of cancer, an annual clinical breast exam, and perform monthly breast self-exams.  Family members should have colonoscopies by at age 86, or earlier, as recommended by their providers. Other members of the family may still carry a pathogenic variant in one of these genes that Ms. Polidore did not inherit. Based on the family history, we  recommend her maternal relatives have genetic counseling and testing. Ms. Perfetti will let us know if we can be of any assistance in coordinating genetic counseling and/or testing for this family member.     FOLLOW-UP:  Lastly, we discussed with Ms. Babe that cancer genetics is a rapidly advancing field and it is possible that new genetic tests will be appropriate for her and/or her family members in the future. We encouraged her to remain in contact with cancer genetics on an annual basis so we can update her personal and family histories and let her know of advances in cancer genetics that may benefit this family.   Our contact number was provided. Ms. Magnan's questions were answered to her satisfaction, and she knows she is welcome to call us at anytime with additional questions or concerns.    Lacy Duverney, MS, [facility] Genetic Counselor [email or web address] Phone: [phone]

## 2023-09-26 NOTE — Telephone Encounter (Signed)
I contacted [name] to discuss her genetic testing results. No pathogenic variants were identified in the 71 genes analyzed. Detailed clinic note to follow.   The test report has been scanned into EPIC and is located under the Molecular Pathology section of the Results Review tab.  A portion of the result report is included below for reference.      Kristine Duverney, MS, [facility] Genetic [email or web address] Phone: [phone]

## 2023-10-25 ENCOUNTER — Other Ambulatory Visit
Admission: RE | Admit: 2023-10-25 | Discharge: 2023-10-25 | Disposition: A | Discharge status: Home / Self Care | Payer: Managed Care, Other (non HMO) | Source: Ambulatory Visit | Attending: Family Medicine | Admitting: Family Medicine

## 2023-10-25 DIAGNOSIS — M79602 Pain in left arm: Secondary | ICD-10-CM | POA: Diagnosis present

## 2023-10-25 LAB — TROPONIN I (HIGH SENSITIVITY): Troponin I (High Sensitivity): 4 ng/L (ref <18)

## 2023-11-25 ENCOUNTER — Ambulatory Visit
Admission: RE | Admit: 2023-11-25 | Discharge: 2023-11-25 | Disposition: A | Discharge status: Home / Self Care | Payer: Managed Care, Other (non HMO) | Source: Ambulatory Visit | Attending: Internal Medicine | Admitting: Internal Medicine

## 2023-11-25 DIAGNOSIS — Z1231 Encounter for screening mammogram for malignant neoplasm of breast: Secondary | ICD-10-CM

## 2023-12-02 ENCOUNTER — Other Ambulatory Visit: Payer: Self-pay | Admitting: Family Medicine

## 2023-12-02 DIAGNOSIS — I10 Essential (primary) hypertension: Secondary | ICD-10-CM

## 2023-12-02 DIAGNOSIS — R079 Chest pain, unspecified: Secondary | ICD-10-CM

## 2023-12-09 ENCOUNTER — Ambulatory Visit
Admission: RE | Admit: 2023-12-09 | Discharge: 2023-12-09 | Disposition: A | Discharge status: Home / Self Care | Payer: Self-pay | Source: Ambulatory Visit | Attending: Family Medicine | Admitting: Family Medicine

## 2023-12-09 DIAGNOSIS — I10 Essential (primary) hypertension: Secondary | ICD-10-CM | POA: Insufficient documentation

## 2023-12-09 DIAGNOSIS — R079 Chest pain, unspecified: Secondary | ICD-10-CM | POA: Insufficient documentation

## 2024-05-24 DIAGNOSIS — R0789 Other chest pain: Secondary | ICD-10-CM | POA: Insufficient documentation

## 2024-05-24 DIAGNOSIS — R002 Palpitations: Secondary | ICD-10-CM | POA: Insufficient documentation

## 2024-05-24 DIAGNOSIS — I2510 Atherosclerotic heart disease of native coronary artery without angina pectoris: Secondary | ICD-10-CM | POA: Insufficient documentation

## 2024-05-24 NOTE — Progress Notes (Unsigned)
 Cardiology Office Note  Date:  05/25/2024   ID:  Kristine, Foley Nov 03, 1966, MRN [id number]  PCP:  Jimmy Moulding, MD   Chief Complaint  Patient presents with   Establish Care    New  pt has been doing well with no complaints of chest pain, chest pressure or SOB, medciation reviewed verbally with patient    HPI:  Ms. Kristine Foley is a 58 year old woman with past medical history of Essential hypertension Hyperlipidemia Atypical chest pain Palpitations Coronary calcium score 30 Who presents by referral from [facility] fields for consultation of her palpitations, coronary calcification  Nov 2024 reports that she was at work: Developed left arm pain BP elevated EKG at work looked benign, troponin normal Blood pressure remained elevated Tried amlodipine, lisinopril Tried xanax Couple months, BP better  Started on metoprolol in 2/25 for palpitations  May 2025: irregular rhythm , BP elevated Metoprolol dosing was increased up to 100 milligrams daily Heart rate 50s to 60, sometimes with low blood pressure Wonders if the dose of metoprolol is too high  Denies significant palpitations or chest pain, no further arm pain  Lab work reviewed Total cholesterol 175 LDL 76  Echo 11/24 NORMAL LEFT VENTRICULAR SYSTOLIC FUNCTION WITH NO LVH  ESTIMATED EF: >55%  NORMAL LA PRESSURES WITH NORMAL DIASTOLIC FUNCTION  NORMAL RIGHT VENTRICULAR SYSTOLIC FUNCTION  VALVULAR REGURGITATION: TRIVIAL AR, TRIVIAL MR, TRIVIAL PR, TRIVIAL TR  NO VALVULAR STENOSIS   Reports having treadmill through primary care which was normal  Tolerating Crestor 20 daily  EKG personally reviewed by myself on todays visit EKG Interpretation Date/Time:  Monday May 25 2024 10:13:00 EDT Ventricular Rate:  68 PR Interval:  156 QRS Duration:  96 QT Interval:  406 QTC Calculation: 431 R Axis:   6  Text Interpretation: Normal sinus rhythm Incomplete right bundle branch block No previous ECGs  available Confirmed by Belva Boyden [phone]) on 05/25/2024 10:32:38 AM    PMH:   has a past medical history of Cancer (HCC) (2010) and Cervical dysplasia (As teenager).  PSH:    Past Surgical History:  Procedure Laterality Date   basal cell excised     Birthmark excised     BREAST BIOPSY Right    CESAREAN SECTION     CHOLECYSTECTOMY     GYNECOLOGIC CRYOSURGERY  As teenager   HER OPTION ABLATION  03/10/2012   plantar warts     TONSILLECTOMY      Current Outpatient Medications  Medication Sig Dispense Refill   busPIRone (BUSPAR) 15 MG tablet Take 15 mg by mouth.     estradiol (ESTRACE) 0.1 MG/GM vaginal cream Insert a fingertip unit amount (0.5g) into your vagina at night twice weekly     metoprolol succinate (TOPROL-XL) 100 MG 24 hr tablet Take 100 mg by mouth.     Multiple Vitamin (MULTIVITAMIN) tablet Take 1 tablet by mouth daily.     Omeprazole (PRILOSEC PO) Take by mouth.     rosuvastatin (CRESTOR) 20 MG tablet Take 20 mg by mouth daily.     traZODone (DESYREL) 50 MG tablet Take 50 mg by mouth at bedtime.     aspirin EC 81 MG tablet Take 81 mg by mouth.     BIOTIN PO Take by mouth.     Cholecalciferol (VITAMIN D  PO) Take by mouth.     triamterene -hydrochlorothiazide (DYAZIDE) 37.5-25 MG capsule TAKE 1 CAPSULE EVERY MORNING AS NEEDED FOR SWELLING MAX OF SEVERAL DAYS PER MONTH PER MD (Patient not taking: Reported  on 05/25/2024) 30 capsule 1   No current facility-administered medications for this visit.    Allergies:   Penicillins   Social History:  The patient  reports that she has never smoked. She has never used smokeless tobacco. She reports that she does not drink alcohol and does not use drugs.   Family History:   family history includes Breast cancer in her mother; Cancer in her maternal grandmother; Colon cancer in her father; Diabetes in her maternal grandmother and mother; Hypertension in her father, maternal grandmother, and mother; Lupus in her sister; Ovarian  cancer (age of onset: 52) in her mother; Prostate cancer in her father; Rheum arthritis in her father.    Review of Systems: Review of Systems  Constitutional: Negative.   HENT: Negative.    Respiratory: Negative.    Cardiovascular:  Positive for chest pain and palpitations.  Gastrointestinal: Negative.   Musculoskeletal: Negative.   Neurological: Negative.   Psychiatric/Behavioral: Negative.    All other systems reviewed and are negative.   PHYSICAL EXAM: VS:  BP 118/78 (BP Location: Left Arm, Patient Position: Sitting)   Pulse 68   Ht 5' 2 (1.575 m)   Wt 163 lb 9.6 oz (74.2 kg)   SpO2 97%   BMI 29.92 kg/m  , BMI Body mass index is 29.92 kg/m. GEN: Well nourished, well developed, in no acute distress HEENT: normal Neck: no JVD, carotid bruits, or masses Cardiac: RRR; no murmurs, rubs, or gallops,no edema  Respiratory:  clear to auscultation bilaterally, normal work of breathing GI: soft, nontender, nondistended, + BS MS: no deformity or atrophy Skin: warm and dry, no rash Neuro:  Strength and sensation are intact Psych: euthymic mood, full affect    Recent Labs: No results found for requested labs within last 365 days.    Lipid Panel Lab Results  Component Value Date   CHOL 211 (H) 10/12/2016   HDL 62 10/12/2016   LDLCALC 114 10/12/2016   TRIG 175 (H) 10/12/2016      Wt Readings from Last 3 Encounters:  05/25/24 163 lb 9.6 oz (74.2 kg)  10/14/17 142 lb (64.4 kg)  10/12/16 129 lb (58.5 kg)       ASSESSMENT AND PLAN:  Problem List Items Addressed This Visit       Cardiology Problems   Coronary artery calcification - Primary   Relevant Medications   aspirin EC 81 MG tablet   metoprolol succinate (TOPROL-XL) 100 MG 24 hr tablet   rosuvastatin (CRESTOR) 20 MG tablet   Other Relevant Orders   EKG 12-Lead (Completed)     Other   Palpitations   Relevant Orders   EKG 12-Lead (Completed)   Atypical chest pain   Relevant Orders   EKG 12-Lead  (Completed)   Chest pain/arm pain Atypical in nature, likely musculoskeletal Cardiac workup at the time negative with normal-appearing echo and treadmill stress test No further symptoms since that time Calcium score 30 CT images pulled up and reviewed On Crestor Risk of aspirin may outweigh benefit  Palpitations/essential hypertension She feels blood pressure running low, heart rate running low on metoprolol succinate 100 Recommend she could try metoprolol succinate 75 daily at 1 time or broken up through the day 50/25 -For worsening symptoms could consider Zio monitor Normal echo and treadmill  Hyperlipidemia Now on Crestor 20 daily, denies having significant side effects Goal LDL less than 70 given calcium score 30  Coronary calcification Images pulled up and reviewed, minimal plaque noted, calcium score 30  No further ischemic workup needed Non-smoker, no diabetes, now on Crestor  Aortic atherosclerosis Minimal plaque in the aorta consistent with minimal coronary calcification Management as above on Crestor  Patient seen in consultation with Glenda fields and will be referred back to her office for ongoing care of the issues detailed above  Signed, Juanda Noon, M.D., Ph.D. [facility] [place], [place] [phone]

## 2024-05-25 ENCOUNTER — Encounter: Payer: Self-pay | Admitting: Cardiovascular Disease

## 2024-05-25 ENCOUNTER — Ambulatory Visit: Attending: Cardiovascular Disease | Admitting: Cardiovascular Disease

## 2024-05-25 VITALS — BP 118/78 | HR 68 | Ht 62.0 in | Wt 163.6 lb

## 2024-05-25 DIAGNOSIS — I2510 Atherosclerotic heart disease of native coronary artery without angina pectoris: Secondary | ICD-10-CM

## 2024-05-25 DIAGNOSIS — R002 Palpitations: Secondary | ICD-10-CM | POA: Diagnosis not present

## 2024-05-25 DIAGNOSIS — R0789 Other chest pain: Secondary | ICD-10-CM | POA: Diagnosis not present

## 2024-05-25 NOTE — Patient Instructions (Signed)
 Call if you would like a zio monitor for palpitations  Medication Instructions:  No changes  If you need a refill on your cardiac medications before your next appointment, please call your pharmacy.   Lab work: No new labs needed  Testing/Procedures: No new testing needed  Follow-Up: At [facility], you and your health needs are our priority.  As part of our continuing mission to provide you with exceptional heart care, we have created designated Provider Care Teams.  These Care Teams include your primary Cardiologist (physician) and Advanced Practice Providers (APPs -  Physician Assistants and Nurse Practitioners) who all work together to provide you with the care you need, when you need it.  You will need a follow up appointment as needed  Providers on your designated Care Team:   Laneta Pintos, NP Varney Gentleman, PA-C Cadence Gennaro Khat, [place]  COVID-19 Vaccine Information can be found at: [email or web address] For questions related to vaccine distribution or appointments, please email [email or web address] or call [phone].

## 2024-06-09 ENCOUNTER — Ambulatory Visit: Admitting: Cardiovascular Disease

## 2024-07-03 ENCOUNTER — Ambulatory Visit
Admission: RE | Admit: 2024-07-03 | Discharge: 2024-07-03 | Disposition: A | Discharge status: Home / Self Care | Source: Ambulatory Visit | Attending: Family Medicine | Admitting: Family Medicine

## 2024-07-03 ENCOUNTER — Other Ambulatory Visit: Payer: Self-pay | Admitting: Family Medicine

## 2024-07-03 ENCOUNTER — Other Ambulatory Visit
Admission: RE | Admit: 2024-07-03 | Discharge: 2024-07-03 | Disposition: A | Discharge status: Home / Self Care | Source: Ambulatory Visit | Attending: Family Medicine | Admitting: Family Medicine

## 2024-07-03 DIAGNOSIS — R7989 Other specified abnormal findings of blood chemistry: Secondary | ICD-10-CM

## 2024-07-03 DIAGNOSIS — M25552 Pain in left hip: Secondary | ICD-10-CM | POA: Diagnosis present

## 2024-07-03 DIAGNOSIS — R079 Chest pain, unspecified: Secondary | ICD-10-CM | POA: Insufficient documentation

## 2024-07-03 DIAGNOSIS — R0602 Shortness of breath: Secondary | ICD-10-CM | POA: Insufficient documentation

## 2024-07-03 DIAGNOSIS — M79605 Pain in left leg: Secondary | ICD-10-CM | POA: Insufficient documentation

## 2024-07-03 LAB — D-DIMER, QUANTITATIVE: D-Dimer, Quant: 2.38 ug{FEU}/mL — ABNORMAL HIGH (ref 0.00–0.50)

## 2024-07-03 MED ORDER — IOHEXOL 350 MG/ML SOLN
75.0000 mL | Freq: Once | INTRAVENOUS | Status: AC | PRN
  Administered 2024-07-03: 75 mL via INTRAVENOUS

## 2024-07-06 ENCOUNTER — Other Ambulatory Visit: Payer: Self-pay | Admitting: Family Medicine

## 2024-07-06 DIAGNOSIS — R7989 Other specified abnormal findings of blood chemistry: Secondary | ICD-10-CM

## 2024-07-06 DIAGNOSIS — R0602 Shortness of breath: Secondary | ICD-10-CM

## 2024-07-08 ENCOUNTER — Other Ambulatory Visit: Payer: Self-pay | Admitting: Family Medicine

## 2024-07-08 DIAGNOSIS — M7989 Other specified soft tissue disorders: Secondary | ICD-10-CM

## 2024-07-08 DIAGNOSIS — M79604 Pain in right leg: Secondary | ICD-10-CM

## 2024-07-09 ENCOUNTER — Ambulatory Visit
Admission: RE | Admit: 2024-07-09 | Discharge: 2024-07-09 | Disposition: A | Discharge status: Home / Self Care | Source: Ambulatory Visit | Attending: Family Medicine | Admitting: Family Medicine

## 2024-07-09 DIAGNOSIS — M7989 Other specified soft tissue disorders: Secondary | ICD-10-CM | POA: Insufficient documentation

## 2024-07-09 DIAGNOSIS — M79604 Pain in right leg: Secondary | ICD-10-CM | POA: Diagnosis present

## 2024-10-29 ENCOUNTER — Other Ambulatory Visit
Admission: RE | Admit: 2024-10-29 | Discharge: 2024-10-29 | Disposition: A | Discharge status: Home / Self Care | Source: Ambulatory Visit | Attending: Internal Medicine | Admitting: Internal Medicine

## 2024-10-29 DIAGNOSIS — M79605 Pain in left leg: Secondary | ICD-10-CM | POA: Insufficient documentation

## 2024-10-29 LAB — D-DIMER, QUANTITATIVE: D-Dimer, Quant: 0.33 ug{FEU}/mL (ref 0.00–0.50)

## 2024-11-12 ENCOUNTER — Other Ambulatory Visit
Admission: RE | Admit: 2024-11-12 | Discharge: 2024-11-12 | Disposition: A | Source: Ambulatory Visit | Attending: Internal Medicine | Admitting: Internal Medicine

## 2024-11-12 DIAGNOSIS — M79605 Pain in left leg: Secondary | ICD-10-CM | POA: Insufficient documentation

## 2024-11-12 LAB — D-DIMER, QUANTITATIVE: D-Dimer, Quant: 0.53 ug{FEU}/mL — ABNORMAL HIGH (ref 0.00–0.50)

## 2024-11-13 ENCOUNTER — Other Ambulatory Visit: Payer: Self-pay | Admitting: Internal Medicine

## 2024-11-13 ENCOUNTER — Ambulatory Visit
Admission: RE | Admit: 2024-11-13 | Discharge: 2024-11-13 | Disposition: A | Source: Ambulatory Visit | Attending: Internal Medicine | Admitting: Internal Medicine

## 2024-11-13 DIAGNOSIS — M79605 Pain in left leg: Secondary | ICD-10-CM
